# Patient Record
Sex: Male | Born: 1960 | Race: White | Hispanic: No | Smoking: Former smoker
Health system: Southern US, Community
[De-identification: ages and names within clinical notes are randomized; demographics above are authoritative.]

## PROBLEM LIST (undated history)

## (undated) DIAGNOSIS — K219 Gastro-esophageal reflux disease without esophagitis: Secondary | ICD-10-CM

## (undated) HISTORY — PX: CYST EXCISION: SHX5701

## (undated) HISTORY — PX: EYE SURGERY: SHX253

---

## 2006-02-26 ENCOUNTER — Ambulatory Visit (HOSPITAL_COMMUNITY): Admission: RE | Admit: 2006-02-26 | Discharge: 2006-02-26 | Payer: Self-pay | Admitting: Family Medicine

## 2009-09-04 ENCOUNTER — Ambulatory Visit (HOSPITAL_COMMUNITY): Admission: RE | Admit: 2009-09-04 | Discharge: 2009-09-04 | Payer: Self-pay | Admitting: Family Medicine

## 2012-02-02 ENCOUNTER — Other Ambulatory Visit (HOSPITAL_COMMUNITY): Payer: Self-pay | Admitting: Family Medicine

## 2012-02-02 ENCOUNTER — Ambulatory Visit (HOSPITAL_COMMUNITY)
Admission: RE | Admit: 2012-02-02 | Discharge: 2012-02-02 | Disposition: A | Discharge status: Home / Self Care | Payer: Self-pay | Source: Ambulatory Visit | Attending: Family Medicine | Admitting: Family Medicine

## 2012-02-02 DIAGNOSIS — M79671 Pain in right foot: Secondary | ICD-10-CM

## 2012-02-02 DIAGNOSIS — T1490XA Injury, unspecified, initial encounter: Secondary | ICD-10-CM

## 2012-02-02 DIAGNOSIS — M79609 Pain in unspecified limb: Secondary | ICD-10-CM | POA: Insufficient documentation

## 2012-04-24 ENCOUNTER — Emergency Department (HOSPITAL_COMMUNITY)
Admission: EM | Admit: 2012-04-24 | Discharge: 2012-04-24 | Disposition: A | Discharge status: Home / Self Care | Payer: BC Managed Care – PPO | Attending: Emergency Medicine | Admitting: Emergency Medicine

## 2012-04-24 ENCOUNTER — Encounter (HOSPITAL_COMMUNITY): Payer: Self-pay

## 2012-04-24 DIAGNOSIS — Y92009 Unspecified place in unspecified non-institutional (private) residence as the place of occurrence of the external cause: Secondary | ICD-10-CM | POA: Insufficient documentation

## 2012-04-24 DIAGNOSIS — S71109A Unspecified open wound, unspecified thigh, initial encounter: Secondary | ICD-10-CM | POA: Insufficient documentation

## 2012-04-24 DIAGNOSIS — W298XXA Contact with other powered powered hand tools and household machinery, initial encounter: Secondary | ICD-10-CM | POA: Insufficient documentation

## 2012-04-24 DIAGNOSIS — Y9389 Activity, other specified: Secondary | ICD-10-CM | POA: Insufficient documentation

## 2012-04-24 DIAGNOSIS — Z87891 Personal history of nicotine dependence: Secondary | ICD-10-CM | POA: Insufficient documentation

## 2012-04-24 DIAGNOSIS — S81812A Laceration without foreign body, left lower leg, initial encounter: Secondary | ICD-10-CM

## 2012-04-24 DIAGNOSIS — Y929 Unspecified place or not applicable: Secondary | ICD-10-CM | POA: Insufficient documentation

## 2012-04-24 DIAGNOSIS — S71009A Unspecified open wound, unspecified hip, initial encounter: Secondary | ICD-10-CM | POA: Insufficient documentation

## 2012-04-24 MED ORDER — LIDOCAINE HCL (PF) 1 % IJ SOLN
5.0000 mL | Freq: Once | INTRAMUSCULAR | Status: AC
  Administered 2012-04-24: 5 mL via INTRADERMAL
  Filled 2012-04-24: qty 5

## 2012-04-24 NOTE — ED Provider Notes (Signed)
History     CSN: [id number]  Arrival date & time 04/24/12  1620   First MD Initiated Contact with Patient 04/24/12 1724      Chief Complaint  Patient presents with  . Extremity Laceration    (Consider location/radiation/quality/duration/timing/severity/associated sxs/prior treatment) Patient is a 52 y.o. male presenting with skin laceration. The history is provided by the patient.  Laceration Location:  Leg Leg laceration location:  R upper leg Length (cm):  4 cm Depth:  Through dermis Quality: straight   Bleeding: controlled   Time since incident: just PTA. Injury mechanism: laceration occurred from a chainsaw through his jeans. Pain details:    Quality:  Aching   Severity:  Mild   Timing:  Constant   Progression:  Unchanged Foreign body present:  No foreign bodies Relieved by:  Nothing Worsened by:  Nothing tried Ineffective treatments:  None tried Tetanus status:  Up to date   History reviewed. No pertinent past medical history.  Past Surgical History  Procedure Laterality Date  . Eye surgery      No family history on file.  History  Substance Use Topics  . Smoking status: Former Games developer  . Smokeless tobacco: Not on file  . Alcohol Use: Yes     Comment: social drinker       Review of Systems  Constitutional: Negative for fever and chills.  Musculoskeletal: Negative for back pain, joint swelling and arthralgias.  Skin: Positive for wound.       Laceration   Neurological: Negative for dizziness, weakness and numbness.  Hematological: Does not bruise/bleed easily.  All other systems reviewed and are negative.    Allergies  Review of patient's allergies indicates no known allergies.  Home Medications   Current Outpatient Rx  Name  Route  Sig  Dispense  Refill  . naproxen sodium (ANAPROX) 220 MG tablet   Oral   Take 440 mg by mouth 2 (two) times daily with a meal.           BP 136/96  Pulse 122  Temp(Src) 97.6 F (36.4 C) (Oral)  Resp  20  Ht 5\' 10"  (1.778 m)  Wt 200 lb (90.719 kg)  BMI 28.7 kg/m2  SpO2 97%  Physical Exam  Nursing note and vitals reviewed. Constitutional: He is oriented to person, place, and time. He appears well-developed and well-nourished. No distress.  HENT:  Head: Normocephalic and atraumatic.  Cardiovascular: Normal rate, regular rhythm and normal heart sounds.   Pulmonary/Chest: Effort normal and breath sounds normal.  Musculoskeletal: He exhibits no edema and no tenderness.  Neurological: He is alert and oriented to person, place, and time. He exhibits normal muscle tone. Coordination normal.  Skin: Laceration noted.     Laceration to the right upper leg just superior to the knee joint.  Bleeding controlled.  No injuries to the deep structures, pt has full ROM of the knee w/o difficulty.  Sensation intact    ED Course  Procedures (including critical care time)  Labs Reviewed - No data to display No results found.      MDM    LACERATION REPAIR Performed by: TRIPLETT,TAMMY L. Authorized by: Maxwell Caul Consent: Verbal consent obtained. Risks and benefits: risks, benefits and alternatives were discussed Consent given by: patient Patient identity confirmed: provided demographic data Prepped and Draped in normal sterile fashion Wound explored  Laceration Location: left lower leg Laceration Length:  4cm  No Foreign Bodies seen or palpated  Anesthesia: local infiltration  Local anesthetic:  lidocaine1 % w/o epinephrine  Anesthetic total: 3 ml  Irrigation method: syringe Amount of cleaning: standard  Skin closure: 4-0 Ethilon Number of sutures: 6 Technique: simple interrupted  Patient tolerance: Patient tolerated the procedure well with no immediate complications.     Bleeding controlled.  Pt has full ROM of the knee.  No muscle, tendon or nerve injury seen.  No FB's  Dressing applied, pain improved.  Pt ambulates w/o difficulty.  Td is UTD   Wound(s)  explored with adequate hemostasis through ROM, no apparent gross foreign body retained, no significant involvement of deep structures such as bone / joint / tendon / or neurovascular involvement noted.  Baseline Strength and Sensation to affected extremity(ies) with normal light touch for Pt, distal NVI with CR< 2 secs and pulse(s) intact to affected extremity(ies).    Tammy L. Trisha Mangle, PA-C 04/27/12 1812

## 2012-04-24 NOTE — ED Notes (Signed)
Patient presents to ER with c/o laceration to left knee. Patient was cutting down some small trees around house with a chainsaw and the chainsaw slipped and cut his leg. Bleeding controlled at this time. Patient ambulated to triage. Patient has approximately a 2.5 inch laceration above knee.

## 2012-04-29 NOTE — ED Provider Notes (Signed)
Medical screening examination/treatment/procedure(s) were performed by non-physician practitioner and as supervising physician I was immediately available for consultation/collaboration.   Shelda Jakes, MD 04/29/12 [phone]

## 2012-08-05 ENCOUNTER — Other Ambulatory Visit (HOSPITAL_COMMUNITY): Payer: Self-pay | Admitting: Family Medicine

## 2012-08-05 ENCOUNTER — Ambulatory Visit (HOSPITAL_COMMUNITY)
Admission: RE | Admit: 2012-08-05 | Discharge: 2012-08-05 | Disposition: A | Discharge status: Home / Self Care | Payer: BC Managed Care – PPO | Source: Ambulatory Visit | Attending: Family Medicine | Admitting: Family Medicine

## 2012-08-05 DIAGNOSIS — Z87891 Personal history of nicotine dependence: Secondary | ICD-10-CM | POA: Insufficient documentation

## 2012-09-30 ENCOUNTER — Emergency Department (HOSPITAL_COMMUNITY): Payer: BC Managed Care – PPO | Admitting: Anesthesiology

## 2012-09-30 ENCOUNTER — Encounter (HOSPITAL_COMMUNITY)
Admission: EM | Disposition: A | Discharge status: Home / Self Care | Payer: Self-pay | Source: Home / Self Care | Attending: Emergency Medicine

## 2012-09-30 ENCOUNTER — Emergency Department (HOSPITAL_COMMUNITY): Payer: BC Managed Care – PPO

## 2012-09-30 ENCOUNTER — Encounter (HOSPITAL_COMMUNITY): Payer: Self-pay | Admitting: Emergency Medicine

## 2012-09-30 ENCOUNTER — Encounter (HOSPITAL_COMMUNITY): Payer: Self-pay | Admitting: Anesthesiology

## 2012-09-30 ENCOUNTER — Emergency Department (HOSPITAL_COMMUNITY)
Admission: EM | Admit: 2012-09-30 | Discharge: 2012-09-30 | Disposition: A | Discharge status: Home / Self Care | Payer: BC Managed Care – PPO | Attending: General Surgery | Admitting: General Surgery

## 2012-09-30 DIAGNOSIS — K3580 Unspecified acute appendicitis: Secondary | ICD-10-CM | POA: Insufficient documentation

## 2012-09-30 DIAGNOSIS — Z01812 Encounter for preprocedural laboratory examination: Secondary | ICD-10-CM | POA: Insufficient documentation

## 2012-09-30 HISTORY — PX: LAPAROSCOPIC APPENDECTOMY: SHX408

## 2012-09-30 HISTORY — DX: Gastro-esophageal reflux disease without esophagitis: K21.9

## 2012-09-30 LAB — URINE MICROSCOPIC-ADD ON

## 2012-09-30 LAB — URINALYSIS, ROUTINE W REFLEX MICROSCOPIC
Ketones, ur: 15 mg/dL — AB
Nitrite: NEGATIVE
pH: 6 (ref 5.0–8.0)

## 2012-09-30 LAB — CBC WITH DIFFERENTIAL/PLATELET
Basophils Absolute: 0 10*3/uL (ref 0.0–0.1)
Eosinophils Absolute: 0 10*3/uL (ref 0.0–0.7)
Hemoglobin: 14.8 g/dL (ref 13.0–17.0)
Lymphs Abs: 1 10*3/uL (ref 0.7–4.0)
MCV: 88.9 fL (ref 78.0–100.0)
Platelets: 192 10*3/uL (ref 150–400)
RBC: 4.87 MIL/uL (ref 4.22–5.81)
RDW: 13.5 % (ref 11.5–15.5)

## 2012-09-30 LAB — COMPREHENSIVE METABOLIC PANEL
Albumin: 4.2 g/dL (ref 3.5–5.2)
Alkaline Phosphatase: 71 U/L (ref 39–117)
CO2: 26 mEq/L (ref 19–32)
Calcium: 9.4 mg/dL (ref 8.4–10.5)
Chloride: 96 mEq/L (ref 96–112)
GFR calc Af Amer: 87 mL/min — ABNORMAL LOW (ref >90)
Glucose, Bld: 119 mg/dL — ABNORMAL HIGH (ref 70–99)
Potassium: 4 mEq/L (ref 3.5–5.1)
Total Bilirubin: 0.5 mg/dL (ref 0.3–1.2)
Total Protein: 7.6 g/dL (ref 6.0–8.3)

## 2012-09-30 LAB — LIPASE, BLOOD: Lipase: 12 U/L (ref 11–59)

## 2012-09-30 SURGERY — APPENDECTOMY, LAPAROSCOPIC
Anesthesia: General | Site: Abdomen | Wound class: Contaminated

## 2012-09-30 MED ORDER — LACTATED RINGERS IV SOLN
INTRAVENOUS | Status: DC
  Administered 2012-09-30: 1000 mL via INTRAVENOUS

## 2012-09-30 MED ORDER — IOHEXOL 300 MG/ML  SOLN
50.0000 mL | Freq: Once | INTRAMUSCULAR | Status: AC | PRN
  Administered 2012-09-30: 50 mL via ORAL

## 2012-09-30 MED ORDER — KETOROLAC TROMETHAMINE 30 MG/ML IJ SOLN
30.0000 mg | Freq: Once | INTRAMUSCULAR | Status: AC
  Administered 2012-09-30: 30 mg via INTRAVENOUS

## 2012-09-30 MED ORDER — SUCCINYLCHOLINE CHLORIDE 20 MG/ML IJ SOLN
INTRAMUSCULAR | Status: DC | PRN
  Administered 2012-09-30: 120 mg via INTRAVENOUS

## 2012-09-30 MED ORDER — LACTATED RINGERS IV SOLN
INTRAVENOUS | Status: DC | PRN
  Administered 2012-09-30 (×2): via INTRAVENOUS

## 2012-09-30 MED ORDER — ONDANSETRON HCL 4 MG/2ML IJ SOLN: 4.0000 mg | Freq: Once | INTRAMUSCULAR | Status: DC | PRN

## 2012-09-30 MED ORDER — ENOXAPARIN SODIUM 40 MG/0.4ML ~~LOC~~ SOLN
40.0000 mg | Freq: Once | SUBCUTANEOUS | Status: AC
  Administered 2012-09-30: 40 mg via SUBCUTANEOUS

## 2012-09-30 MED ORDER — PROPOFOL 10 MG/ML IV BOLUS
INTRAVENOUS | Status: DC | PRN
  Administered 2012-09-30: 20 mg via INTRAVENOUS
  Administered 2012-09-30: 150 mg via INTRAVENOUS

## 2012-09-30 MED ORDER — SODIUM CHLORIDE 0.9 % IV SOLN
1.0000 g | INTRAVENOUS | Status: AC
  Administered 2012-09-30: 1 g via INTRAVENOUS

## 2012-09-30 MED ORDER — ONDANSETRON HCL 4 MG/2ML IJ SOLN
4.0000 mg | Freq: Once | INTRAMUSCULAR | Status: AC
  Administered 2012-09-30: 4 mg via INTRAVENOUS
  Filled 2012-09-30: qty 2

## 2012-09-30 MED ORDER — LIDOCAINE HCL (CARDIAC) 20 MG/ML IV SOLN
INTRAVENOUS | Status: DC | PRN
  Administered 2012-09-30: 20 mg via INTRAVENOUS

## 2012-09-30 MED ORDER — FENTANYL CITRATE 0.05 MG/ML IJ SOLN
INTRAMUSCULAR | Status: DC | PRN
  Administered 2012-09-30 (×6): 50 ug via INTRAVENOUS

## 2012-09-30 MED ORDER — OXYCODONE-ACETAMINOPHEN 7.5-325 MG PO TABS: 1.0000 | ORAL_TABLET | ORAL | Status: DC | PRN

## 2012-09-30 MED ORDER — NEOSTIGMINE METHYLSULFATE 1 MG/ML IJ SOLN
INTRAMUSCULAR | Status: DC | PRN
  Administered 2012-09-30: 1 mg via INTRAVENOUS
  Administered 2012-09-30: 2 mg via INTRAVENOUS

## 2012-09-30 MED ORDER — MORPHINE SULFATE 4 MG/ML IJ SOLN
4.0000 mg | Freq: Once | INTRAMUSCULAR | Status: AC
  Administered 2012-09-30: 4 mg via INTRAVENOUS
  Filled 2012-09-30: qty 1

## 2012-09-30 MED ORDER — ROCURONIUM BROMIDE 100 MG/10ML IV SOLN
INTRAVENOUS | Status: DC | PRN
  Administered 2012-09-30 (×2): 5 mg via INTRAVENOUS
  Administered 2012-09-30: 20 mg via INTRAVENOUS
  Administered 2012-09-30: 5 mg via INTRAVENOUS

## 2012-09-30 MED ORDER — IOHEXOL 300 MG/ML  SOLN
100.0000 mL | Freq: Once | INTRAMUSCULAR | Status: AC | PRN
  Administered 2012-09-30: 100 mL via INTRAVENOUS

## 2012-09-30 MED ORDER — ONDANSETRON HCL 4 MG/2ML IJ SOLN
4.0000 mg | Freq: Once | INTRAMUSCULAR | Status: AC
  Administered 2012-09-30: 4 mg via INTRAVENOUS

## 2012-09-30 MED ORDER — MIDAZOLAM HCL 5 MG/5ML IJ SOLN
INTRAMUSCULAR | Status: DC | PRN
  Administered 2012-09-30 (×2): 1 mg via INTRAVENOUS

## 2012-09-30 MED ORDER — MIDAZOLAM HCL 2 MG/2ML IJ SOLN
1.0000 mg | INTRAMUSCULAR | Status: DC | PRN
Start: 2012-09-30 — End: 2012-09-30
  Administered 2012-09-30: 2 mg via INTRAVENOUS

## 2012-09-30 MED ORDER — AMOXICILLIN-POT CLAVULANATE 875-125 MG PO TABS: 1.0000 | ORAL_TABLET | Freq: Two times a day (BID) | ORAL | Status: DC

## 2012-09-30 MED ORDER — BUPIVACAINE HCL (PF) 0.5 % IJ SOLN
INTRAMUSCULAR | Status: DC | PRN
  Administered 2012-09-30: 10 mL

## 2012-09-30 MED ORDER — FENTANYL CITRATE 0.05 MG/ML IJ SOLN
25.0000 ug | INTRAMUSCULAR | Status: AC
  Administered 2012-09-30 (×2): 25 ug via INTRAVENOUS

## 2012-09-30 MED ORDER — FENTANYL CITRATE 0.05 MG/ML IJ SOLN
25.0000 ug | INTRAMUSCULAR | Status: DC | PRN
  Administered 2012-09-30 (×2): 50 ug via INTRAVENOUS

## 2012-09-30 MED ORDER — GLYCOPYRROLATE 0.2 MG/ML IJ SOLN
INTRAMUSCULAR | Status: DC | PRN
  Administered 2012-09-30: 0.2 mg via INTRAVENOUS
  Administered 2012-09-30: 0.4 mg via INTRAVENOUS

## 2012-09-30 MED ORDER — SODIUM CHLORIDE 0.9 % IR SOLN
Status: DC | PRN
  Administered 2012-09-30: 1000 mL
  Administered 2012-09-30: 3000 mL

## 2012-09-30 SURGICAL SUPPLY — 48 items
BAG HAMPER (MISCELLANEOUS) ×2 IMPLANT
BAG SPEC RTRVL LRG 6X4 10 (ENDOMECHANICALS) ×1
CLOTH BEACON ORANGE TIMEOUT ST (SAFETY) ×2 IMPLANT
COVER LIGHT HANDLE STERIS (MISCELLANEOUS) ×4 IMPLANT
CUTTER LINEAR ENDO 35 ETS (STAPLE) IMPLANT
CUTTER LINEAR ENDO 35 ETS TH (STAPLE) ×1 IMPLANT
DECANTER SPIKE VIAL GLASS SM (MISCELLANEOUS) ×2 IMPLANT
DURAPREP 26ML APPLICATOR (WOUND CARE) ×2 IMPLANT
ELECT REM PT RETURN 9FT ADLT (ELECTROSURGICAL) ×2
ELECTRODE REM PT RTRN 9FT ADLT (ELECTROSURGICAL) ×1 IMPLANT
FILTER SMOKE EVAC LAPAROSHD (FILTER) ×2 IMPLANT
FORMALIN 10 PREFIL 120ML (MISCELLANEOUS) ×2 IMPLANT
GLOVE BIO SURGEON STRL SZ7.5 (GLOVE) ×2 IMPLANT
GLOVE BIOGEL PI IND STRL 7.0 (GLOVE) IMPLANT
GLOVE BIOGEL PI INDICATOR 7.0 (GLOVE) ×1
GLOVE EXAM NITRILE LRG STRL (GLOVE) ×2 IMPLANT
GLOVE SS BIOGEL STRL SZ 6.5 (GLOVE) IMPLANT
GLOVE SUPERSENSE BIOGEL SZ 6.5 (GLOVE) ×1
GOWN STRL REIN XL XLG (GOWN DISPOSABLE) ×4 IMPLANT
INST SET LAPROSCOPIC AP (KITS) ×2 IMPLANT
IV NS IRRIG 3000ML ARTHROMATIC (IV SOLUTION) ×1 IMPLANT
KIT ROOM TURNOVER APOR (KITS) ×2 IMPLANT
MANIFOLD NEPTUNE II (INSTRUMENTS) ×2 IMPLANT
NDL INSUFFLATION 14GA 120MM (NEEDLE) ×1 IMPLANT
NEEDLE INSUFFLATION 14GA 120MM (NEEDLE) ×2 IMPLANT
NS IRRIG 1000ML POUR BTL (IV SOLUTION) ×2 IMPLANT
PACK LAP CHOLE LZT030E (CUSTOM PROCEDURE TRAY) ×2 IMPLANT
PAD ARMBOARD 7.5X6 YLW CONV (MISCELLANEOUS) ×2 IMPLANT
PENCIL HANDSWITCHING (ELECTRODE) ×1 IMPLANT
POUCH SPECIMEN RETRIEVAL 10MM (ENDOMECHANICALS) ×2 IMPLANT
RELOAD /EVU35 (ENDOMECHANICALS) IMPLANT
RELOAD 45 VASCULAR/THIN (ENDOMECHANICALS) IMPLANT
RELOAD CUTTER ETS 35MM STAND (ENDOMECHANICALS) IMPLANT
RELOAD STAPLE 45 2.5 WHT GRN (ENDOMECHANICALS) IMPLANT
SCALPEL HARMONIC ACE (MISCELLANEOUS) ×2 IMPLANT
SET BASIN LINEN APH (SET/KITS/TRAYS/PACK) ×2 IMPLANT
SET TUBE IRRIG SUCTION NO TIP (IRRIGATION / IRRIGATOR) ×1 IMPLANT
SPONGE GAUZE 2X2 8PLY STRL LF (GAUZE/BANDAGES/DRESSINGS) ×6 IMPLANT
STAPLER VISISTAT (STAPLE) ×2 IMPLANT
SUT VICRYL 0 UR6 27IN ABS (SUTURE) ×2 IMPLANT
TAPE CLOTH SURG 4X10 WHT LF (GAUZE/BANDAGES/DRESSINGS) ×1 IMPLANT
TRAY FOLEY CATH 16FR SILVER (SET/KITS/TRAYS/PACK) ×2 IMPLANT
TROCAR Z-THAD FIOS HNDL 12X100 (TROCAR) ×2 IMPLANT
TROCAR Z-THRD FIOS HNDL 11X100 (TROCAR) ×2 IMPLANT
TROCAR Z-THREAD FIOS 5X100MM (TROCAR) ×2 IMPLANT
TUBING INSUFFLATION (TUBING) ×2 IMPLANT
WARMER LAPAROSCOPE (MISCELLANEOUS) ×2 IMPLANT
YANKAUER SUCT 12FT TUBE ARGYLE (SUCTIONS) ×2 IMPLANT

## 2012-09-30 NOTE — H&P (Signed)
David Adkins is an 52 y.o. male.   Chief Complaint: Abdominal pain HPI: Patient is a 52 year old white male who presents to the emergency room with a less than 12 hour history of worsening right lower quadrant abdominal pain. He was noted to have a leukocytosis. CT scan the abdomen and pelvis reveals acute appendicitis.  History reviewed. No pertinent past medical history.  Past Surgical History  Procedure Laterality Date  . Eye surgery      No family history on file. Social History:  reports that he has quit smoking. He does not have any smokeless tobacco history on file. He reports that  drinks alcohol. He reports that he does not use illicit drugs.  Allergies: No Known Allergies   (Not in a hospital admission)  Results for orders placed during the hospital encounter of 09/30/12 (from the past 48 hour(s))  URINALYSIS, ROUTINE W REFLEX MICROSCOPIC     Status: Abnormal   Collection Time    09/30/12  9:45 AM      Result Value Range   Color, Urine YELLOW  YELLOW   APPearance CLEAR  CLEAR   Specific Gravity, Urine 1.025  1.005 - 1.030   pH 6.0  5.0 - 8.0   Glucose, UA NEGATIVE  NEGATIVE mg/dL   Hgb urine dipstick TRACE (*) NEGATIVE   Bilirubin Urine NEGATIVE  NEGATIVE   Ketones, ur 15 (*) NEGATIVE mg/dL   Protein, ur NEGATIVE  NEGATIVE mg/dL   Urobilinogen, UA 0.2  0.0 - 1.0 mg/dL   Nitrite NEGATIVE  NEGATIVE   Leukocytes, UA NEGATIVE  NEGATIVE  URINE MICROSCOPIC-ADD ON     Status: None   Collection Time    09/30/12  9:45 AM      Result Value Range   RBC / HPF 3-6  <3 RBC/hpf  CBC WITH DIFFERENTIAL     Status: Abnormal   Collection Time    09/30/12  9:50 AM      Result Value Range   WBC 24.8 (*) 4.0 - 10.5 K/uL   RBC 4.87  4.22 - 5.81 MIL/uL   Hemoglobin 14.8  13.0 - 17.0 g/dL   HCT 16.1  09.6 - 04.5 %   MCV 88.9  78.0 - 100.0 fL   MCH 30.4  26.0 - 34.0 pg   MCHC 34.2  30.0 - 36.0 g/dL   RDW 40.9  81.1 - 91.4 %   Platelets 192  150 - 400 K/uL   Neutrophils  Relative % 91 (*) 43 - 77 %   Neutro Abs 22.6 (*) 1.7 - 7.7 K/uL   Lymphocytes Relative 4 (*) 12 - 46 %   Lymphs Abs 1.0  0.7 - 4.0 K/uL   Monocytes Relative 5  3 - 12 %   Monocytes Absolute 1.2 (*) 0.1 - 1.0 K/uL   Eosinophils Relative 0  0 - 5 %   Eosinophils Absolute 0.0  0.0 - 0.7 K/uL   Basophils Relative 0  0 - 1 %   Basophils Absolute 0.0  0.0 - 0.1 K/uL   WBC Morphology INCREASED BANDS (>20% BANDS)     Comment: TOXIC GRANULATION  COMPREHENSIVE METABOLIC PANEL     Status: Abnormal   Collection Time    09/30/12  9:50 AM      Result Value Range   Sodium 133 (*) 135 - 145 mEq/L   Potassium 4.0  3.5 - 5.1 mEq/L   Chloride 96  96 - 112 mEq/L   CO2 26  19 -  32 mEq/L   Glucose, Bld 119 (*) 70 - 99 mg/dL   BUN 18  6 - 23 mg/dL   Creatinine, Ser 1.61  0.50 - 1.35 mg/dL   Calcium 9.4  8.4 - 09.6 mg/dL   Total Protein 7.6  6.0 - 8.3 g/dL   Albumin 4.2  3.5 - 5.2 g/dL   AST 19  0 - 37 U/L   ALT 19  0 - 53 U/L   Alkaline Phosphatase 71  39 - 117 U/L   Total Bilirubin 0.5  0.3 - 1.2 mg/dL   GFR calc non Af Amer 75 (*) >90 mL/min   GFR calc Af Amer 87 (*) >90 mL/min   Comment:            The eGFR has been calculated     using the CKD EPI equation.     This calculation has not been     validated in all clinical     situations.     eGFR's persistently     <90 mL/min signify     possible Chronic Kidney Disease.  LIPASE, BLOOD     Status: None   Collection Time    09/30/12  9:50 AM      Result Value Range   Lipase 12  11 - 59 U/L   Ct Abdomen Pelvis W Contrast  09/30/2012   *RADIOLOGY REPORT*  Clinical Data: Pelvic pain.  Nausea and vomiting.  CT ABDOMEN AND PELVIS WITH CONTRAST  Technique:  Multidetector CT imaging of the abdomen and pelvis was performed following the standard protocol during bolus administration of intravenous contrast.  Contrast: 50mL OMNIPAQUE IOHEXOL 300 MG/ML  SOLN, OMNIPAQUE IOHEXOL 300 MG/ML  SOLN  Comparison: None.  Findings: Subsegmental atelectasis  or scarring noted in the left lower lobe.  The liver, spleen, pancreas, and adrenal glands appear unremarkable.  The gallbladder and biliary system appear unremarkable.  The kidneys appear unremarkable, as do the proximal ureters.  Acute appendicitis is observed with appendiceal diameter 1.3 cm and with considerable periappendiceal stranding.  I do not observe extraluminal gas or a drainable abscess.  High density in the appendix may represent appendicoliths.  Small right inguinal hernia contains adipose tissue.  Mild aortoiliac atherosclerotic vascular disease noted.  No free pelvic fluid.  Urinary bladder unremarkable.  Lumbar spondylosis and degenerative disc disease observed.  IMPRESSION:  1.  Acute appendicitis, appendiceal diameter 1.3 cm, with considerable periappendiceal stranding.  No extraluminal gas or drainable abscess. 2.  Small right inguinal hernia contains adipose tissue. 3.  Mild atherosclerosis. 4.  Lumbar spondylosis and degenerative disc disease.  These results will be called to the ordering clinician or representative by the Radiologist Assistant, and communication documented in the PACS Dashboard.   Original Report Authenticated By: Gaylyn Rong, M.D.    Review of Systems  Constitutional: Positive for malaise/fatigue.  Gastrointestinal: Positive for abdominal pain.  All other systems reviewed and are negative.    Blood pressure 114/76, pulse 69, temperature 97.5 F (36.4 C), resp. rate 18, height 5\' 9"  (1.753 m), weight 80.74 kg (178 lb), SpO2 97.00%. Physical Exam  Constitutional: He is oriented to person, place, and time. He appears well-developed and well-nourished.  HENT:  Head: Normocephalic and atraumatic.  Neck: Normal range of motion. Neck supple.  Cardiovascular: Normal rate, regular rhythm and normal heart sounds.   Respiratory: Effort normal and breath sounds normal.  GI: Soft. There is tenderness.  Tender in the right lower quadrant to deep palpation.  No  rigidity noted.  Neurological: He is alert and oriented to person, place, and time.  Skin: Skin is warm and dry.     Assessment/Plan Impression: Acute appendicitis Plan: Patient will be taken to the operating room today for laparoscopic appendectomy. The risks and benefits of the procedure including bleeding, infection, and the possibility of an open procedure were fully explained to the patient, who gave informed consent.  Liahm Grivas A 09/30/2012, 11:24 AM

## 2012-09-30 NOTE — ED Notes (Signed)
Pt c/o generalized abd pain since 0330. Denies n/v/d. lnbm this am. nad noted.

## 2012-09-30 NOTE — Transfer of Care (Signed)
Immediate Anesthesia Transfer of Care Note  Patient: David Adkins  Procedure(s) Performed: Procedure(s) (LRB): APPENDECTOMY LAPAROSCOPIC (N/A)  Patient Location: PACU  Anesthesia Type: General  Level of Consciousness: awake  Airway & Oxygen Therapy: Patient Spontanous Breathing and non-rebreather face mask  Post-op Assessment: Report given to PACU RN, Post -op Vital signs reviewed and stable and Patient moving all extremities  Post vital signs: Reviewed and stable  Complications: No apparent anesthesia complications

## 2012-09-30 NOTE — ED Provider Notes (Signed)
Medical screening examination/treatment/procedure(s) were performed by non-physician practitioner and as supervising physician I was immediately available for consultation/collaboration.   Tenika Keeran, MD 09/30/12 1438 

## 2012-09-30 NOTE — Anesthesia Preprocedure Evaluation (Signed)
Anesthesia Evaluation  Patient identified by MRN, date of birth, ID band Patient awake    Reviewed: Allergy & Precautions, H&P , NPO status , Patient's Chart, lab work & pertinent test results  History of Anesthesia Complications Negative for: history of anesthetic complications  Airway Mallampati: II TM Distance: >3 FB Neck ROM: Full    Dental  (+) Teeth Intact   Pulmonary neg pulmonary ROS, former smoker,  breath sounds clear to auscultation        Cardiovascular negative cardio ROS  Rhythm:Regular Rate:Normal     Neuro/Psych    GI/Hepatic GERD-  Medicated and Controlled,RLQ pain   Endo/Other    Renal/GU      Musculoskeletal   Abdominal   Peds  Hematology   Anesthesia Other Findings   Reproductive/Obstetrics                           Anesthesia Physical Anesthesia Plan  ASA: II  Anesthesia Plan: General   Post-op Pain Management:    Induction: Intravenous, Rapid sequence and Cricoid pressure planned  Airway Management Planned: Oral ETT  Additional Equipment:   Intra-op Plan:   Post-operative Plan: Extubation in OR  Informed Consent: I have reviewed the patients History and Physical, chart, labs and discussed the procedure including the risks, benefits and alternatives for the proposed anesthesia with the patient or authorized representative who has indicated his/her understanding and acceptance.     Plan Discussed with:   Anesthesia Plan Comments:         Anesthesia Quick Evaluation

## 2012-09-30 NOTE — Anesthesia Procedure Notes (Signed)
Procedure Name: Intubation Date/Time: 09/30/2012 1:36 PM Performed by: Franco Nones Pre-anesthesia Checklist: Patient identified, Patient being monitored, Timeout performed, Emergency Drugs available and Suction available Patient Re-evaluated:Patient Re-evaluated prior to inductionOxygen Delivery Method: Circle System Utilized Preoxygenation: Pre-oxygenation with 100% oxygen Intubation Type: IV induction, Rapid sequence and Cricoid Pressure applied Laryngoscope Size: Miller and 2 Grade View: Grade I Tube type: Oral Tube size: 8.0 mm Number of attempts: 1 Airway Equipment and Method: stylet Placement Confirmation: ETT inserted through vocal cords under direct vision,  positive ETCO2 and breath sounds checked- equal and bilateral Secured at: 22 cm Tube secured with: Tape Dental Injury: Teeth and Oropharynx as per pre-operative assessment

## 2012-09-30 NOTE — Anesthesia Postprocedure Evaluation (Signed)
Anesthesia Post Note  Patient: David Adkins  Procedure(s) Performed: Procedure(s) (LRB): APPENDECTOMY LAPAROSCOPIC (N/A)  Anesthesia type: General  Patient location: PACU  Post pain: Pain level controlled  Post assessment: Post-op Vital signs reviewed, Patient's Cardiovascular Status Stable, Respiratory Function Stable, Patent Airway, No signs of Nausea or vomiting and Pain level controlled  Last Vitals:  Filed Vitals:   09/30/12 1439  BP: 144/79  Pulse: 92  Temp: 36.9 C  Resp: 24    Post vital signs: Reviewed and stable  Level of consciousness: awake and alert   Complications: No apparent anesthesia complications

## 2012-09-30 NOTE — Op Note (Signed)
Patient:  David Adkins  DOB:  Jul 10, 1960  MRN:  [id number]   Preop Diagnosis:  Acute appendicitis  Postop Diagnosis:  Same  Procedure:  Laparoscopic appendectomy  Surgeon:  Franky Macho, M.D.  Anes:  General endotracheal  Indications:  Patient is a 52 year old white male who presents with a less than 12 hour history of worsening right lower quadrant abdominal pain. He is on CT scan the abdomen to have acute appendicitis. It is somewhat retrocecal in nature. The risks and benefits of the procedure including bleeding, infection, and the possibility of an open procedure were fully explained to the patient, who gave informed consent.  Procedure note:  The patient is placed the supine position. After induction of general endotracheal anesthesia, the abdomen was prepped and draped using usual sterile technique with DuraPrep. Surgical site confirmation was performed.  A supraumbilical incision was made down to the fascia. A Veress needle was introduced into the abdominal cavity and confirmation of placement was done using the saline drop test. The abdomen was then insufflated to 16 mm mercury pressure. An 11 mm trocar was introduced into the abdominal cavity under direct visualization without difficulty. The patient was placed in deeper Trendelenburg position and additional 12 mm trocar was placed the suprapubic region and a 5 mm trocar was placed left lower corner region. The appendix was visualized and noted to be retrocecal in nature. The tip of the appendix had curved and was pointed inferior nature. While this was being grasped, a fecalith spilled out of it. It was retrieved. There was minimal spillage of any purulent fluid. The appendix was noted to be somewhat gangrenous and cystoscopy half. Using both the harmonic scalpel and blunt dissection, the appendix was freed away from its retrocecal position. The mesoappendix was carefully divided using the harmonic scalpel. Once the base the appendix  was visualized, a vascular Endo GIA stapler was placed across the base the appendix and fired. The appendix was then removed using an Endo Catch bag and sent to pathology further examination. The right lower quadrant was copiously irrigated normal saline. All fluid and air were then evacuated from the abdominal cavity prior to removal of the trochars.  All wounds were irrigated normal saline. All wounds were injected with 0.5% Sensorcaine. The supraumbilical fascia as well as suprapubic fascia were reapproximated using 0 Vicryl interrupted sutures. All skin incisions were closed using staples. Betadine ointment and dressed a dressings were applied.  All tape and needle counts were correct the end of the procedure. Patient was extubated in the operating room and transferred to PACU in stable condition.  Complications:  None  EBL:  Minimal  Specimen:  Appendix

## 2012-09-30 NOTE — Preoperative (Signed)
Beta Blockers   Reason not to administer Beta Blockers:Not Applicable 

## 2012-09-30 NOTE — ED Notes (Signed)
[facility] Radiology called with results of appendicitis. Given to PA J Idol.

## 2012-09-30 NOTE — ED Provider Notes (Signed)
CSN: [id number]     Arrival date & time 09/30/12  4098 History     First MD Initiated Contact with Patient 09/30/12 [phone]     Chief Complaint  Patient presents with  . Abdominal Pain   (Consider location/radiation/quality/duration/timing/severity/associated sxs/prior Treatment) HPI Comments: David Adkins is a 52 y.o. Male presenting with lower bilateral abdominal pain with intermittent sharp stabs of pain into his back and upper abdomen which woke him around 3:30 am today.  He denies nausea or vomiting and has had no fevers or chills, no diarrhea or constipation,  Stating his last normal movement was this morning, was non bloody and did not alleviate or worsen his symptoms.  However the pain has escalated and is now constant.  He feels like he has "bad gas" and has tried tums and drank epsom salt water with no relief.  He denies prior episodes of similar pain and has no past medical or surgical history.      The history is provided by the patient.    History reviewed. No pertinent past medical history. Past Surgical History  Procedure Laterality Date  . Eye surgery     No family history on file. History  Substance Use Topics  . Smoking status: Former Games developer  . Smokeless tobacco: Not on file  . Alcohol Use: Yes     Comment: social drinker     Review of Systems  Constitutional: Negative for fever and chills.  HENT: Negative for congestion, sore throat and neck pain.   Eyes: Negative.   Respiratory: Negative for chest tightness and shortness of breath.   Cardiovascular: Negative for chest pain.  Gastrointestinal: Positive for abdominal pain and abdominal distention. Negative for nausea, vomiting, diarrhea and constipation.  Genitourinary: Negative.  Negative for dysuria and difficulty urinating.  Musculoskeletal: Negative for joint swelling and arthralgias.  Skin: Negative.  Negative for rash and wound.  Neurological: Negative for dizziness, weakness, light-headedness,  numbness and headaches.  Psychiatric/Behavioral: Negative.     Allergies  Review of patient's allergies indicates no known allergies.  Home Medications   Current Outpatient Rx  Name  Route  Sig  Dispense  Refill  . HYDROcodone-acetaminophen (NORCO) 10-325 MG per tablet   Oral   Take 1 tablet by mouth every 6 (six) hours as needed for pain.         Marland Kitchen ibuprofen (ADVIL,MOTRIN) 800 MG tablet   Oral   Take 800 mg by mouth every 8 (eight) hours as needed for pain.          BP 114/76  Pulse 69  Temp(Src) 97.5 F (36.4 C)  Resp 18  Ht 5\' 9"  (1.753 m)  Wt 178 lb (80.74 kg)  BMI 26.27 kg/m2  SpO2 97% Physical Exam  Nursing note and vitals reviewed. Constitutional: He appears well-developed and well-nourished. He appears distressed.  Patient appears fairly uncomfortable.  HENT:  Head: Normocephalic and atraumatic.  Mouth/Throat: Oropharynx is clear and moist.  Eyes: Conjunctivae are normal.  Neck: Normal range of motion.  Cardiovascular: Normal rate, regular rhythm, normal heart sounds and intact distal pulses.   Pulmonary/Chest: Effort normal and breath sounds normal. He has no wheezes.  Abdominal: He exhibits distension. He exhibits no abdominal bruit and no ascites. Bowel sounds are increased. There is generalized tenderness. There is guarding. There is no rebound and no CVA tenderness.  Musculoskeletal: Normal range of motion.  Neurological: He is alert.  Skin: Skin is warm and dry. He is not diaphoretic.  Psychiatric:  He has a normal mood and affect.    ED Course   Procedures (including critical care time)  Labs Reviewed  CBC WITH DIFFERENTIAL - Abnormal; Notable for the following:    WBC 24.8 (*)    Neutrophils Relative % 91 (*)    Neutro Abs 22.6 (*)    Lymphocytes Relative 4 (*)    Monocytes Absolute 1.2 (*)    All other components within normal limits  COMPREHENSIVE METABOLIC PANEL - Abnormal; Notable for the following:    Sodium 133 (*)    Glucose, Bld  119 (*)    GFR calc non Af Amer 75 (*)    GFR calc Af Amer 87 (*)    All other components within normal limits  URINALYSIS, ROUTINE W REFLEX MICROSCOPIC - Abnormal; Notable for the following:    Hgb urine dipstick TRACE (*)    Ketones, ur 15 (*)    All other components within normal limits  LIPASE, BLOOD  URINE MICROSCOPIC-ADD ON   Ct Abdomen Pelvis W Contrast  09/30/2012   *RADIOLOGY REPORT*  Clinical Data: Pelvic pain.  Nausea and vomiting.  CT ABDOMEN AND PELVIS WITH CONTRAST  Technique:  Multidetector CT imaging of the abdomen and pelvis was performed following the standard protocol during bolus administration of intravenous contrast.  Contrast: 50mL OMNIPAQUE IOHEXOL 300 MG/ML  SOLN, OMNIPAQUE IOHEXOL 300 MG/ML  SOLN  Comparison: None.  Findings: Subsegmental atelectasis or scarring noted in the left lower lobe.  The liver, spleen, pancreas, and adrenal glands appear unremarkable.  The gallbladder and biliary system appear unremarkable.  The kidneys appear unremarkable, as do the proximal ureters.  Acute appendicitis is observed with appendiceal diameter 1.3 cm and with considerable periappendiceal stranding.  I do not observe extraluminal gas or a drainable abscess.  High density in the appendix may represent appendicoliths.  Small right inguinal hernia contains adipose tissue.  Mild aortoiliac atherosclerotic vascular disease noted.  No free pelvic fluid.  Urinary bladder unremarkable.  Lumbar spondylosis and degenerative disc disease observed.  IMPRESSION:  1.  Acute appendicitis, appendiceal diameter 1.3 cm, with considerable periappendiceal stranding.  No extraluminal gas or drainable abscess. 2.  Small right inguinal hernia contains adipose tissue. 3.  Mild atherosclerosis. 4.  Lumbar spondylosis and degenerative disc disease.  These results will be called to the ordering clinician or representative by the Radiologist Assistant, and communication documented in the PACS Dashboard.    Original Report Authenticated By: Gaylyn Rong, M.D.   1. Appendicitis, acute     MDM  Patients labs and/or radiological studies were viewed and considered during the medical decision making and disposition process.  Spoke with pt informing him of diagnosis.  Call placed to Dr. Lovell Sheehan - will be here in a few minutes to eval patient.  He is comfortable at this time after second dose of abx.  Burgess Amor, PA-C 09/30/12 1112

## 2012-09-30 NOTE — ED Notes (Signed)
Dr Jenkins in with pt. 

## 2012-09-30 NOTE — ED Notes (Signed)
Ct aware pt finished contrast 

## 2012-10-03 ENCOUNTER — Encounter (HOSPITAL_COMMUNITY): Payer: Self-pay | Admitting: General Surgery

## 2012-11-17 ENCOUNTER — Ambulatory Visit (INDEPENDENT_AMBULATORY_CARE_PROVIDER_SITE_OTHER): Payer: BC Managed Care – PPO | Admitting: Otolaryngology

## 2012-11-17 DIAGNOSIS — R220 Localized swelling, mass and lump, head: Secondary | ICD-10-CM

## 2012-11-18 ENCOUNTER — Other Ambulatory Visit (INDEPENDENT_AMBULATORY_CARE_PROVIDER_SITE_OTHER): Payer: Self-pay | Admitting: Otolaryngology

## 2012-11-22 ENCOUNTER — Ambulatory Visit (HOSPITAL_COMMUNITY)
Admission: RE | Admit: 2012-11-22 | Discharge: 2012-11-22 | Disposition: A | Discharge status: Home / Self Care | Payer: BC Managed Care – PPO | Source: Ambulatory Visit | Attending: Otolaryngology | Admitting: Otolaryngology

## 2012-11-22 DIAGNOSIS — R220 Localized swelling, mass and lump, head: Secondary | ICD-10-CM | POA: Insufficient documentation

## 2012-11-22 MED ORDER — IOHEXOL 300 MG/ML  SOLN
75.0000 mL | Freq: Once | INTRAMUSCULAR | Status: AC | PRN
  Administered 2012-11-22: 75 mL via INTRAVENOUS

## 2012-12-01 ENCOUNTER — Ambulatory Visit (INDEPENDENT_AMBULATORY_CARE_PROVIDER_SITE_OTHER): Payer: BC Managed Care – PPO | Admitting: Otolaryngology

## 2012-12-01 ENCOUNTER — Encounter (INDEPENDENT_AMBULATORY_CARE_PROVIDER_SITE_OTHER): Payer: Self-pay

## 2012-12-01 DIAGNOSIS — D37039 Neoplasm of uncertain behavior of the major salivary glands, unspecified: Secondary | ICD-10-CM

## 2012-12-07 ENCOUNTER — Encounter (HOSPITAL_BASED_OUTPATIENT_CLINIC_OR_DEPARTMENT_OTHER): Payer: Self-pay | Admitting: *Deleted

## 2012-12-07 NOTE — Progress Notes (Signed)
To bring overnight bag and all meds-no labs needed He had emergency append 8/14

## 2012-12-12 ENCOUNTER — Ambulatory Visit (HOSPITAL_BASED_OUTPATIENT_CLINIC_OR_DEPARTMENT_OTHER): Payer: BC Managed Care – PPO | Admitting: Anesthesiology

## 2012-12-12 ENCOUNTER — Encounter (HOSPITAL_BASED_OUTPATIENT_CLINIC_OR_DEPARTMENT_OTHER): Payer: Self-pay | Admitting: *Deleted

## 2012-12-12 ENCOUNTER — Encounter (HOSPITAL_BASED_OUTPATIENT_CLINIC_OR_DEPARTMENT_OTHER)
Admission: RE | Disposition: A | Discharge status: Home / Self Care | Payer: Self-pay | Source: Ambulatory Visit | Attending: Otolaryngology

## 2012-12-12 ENCOUNTER — Ambulatory Visit (HOSPITAL_BASED_OUTPATIENT_CLINIC_OR_DEPARTMENT_OTHER)
Admission: RE | Admit: 2012-12-12 | Discharge: 2012-12-13 | Disposition: A | Discharge status: Home / Self Care | Payer: BC Managed Care – PPO | Source: Ambulatory Visit | Attending: Otolaryngology | Admitting: Otolaryngology

## 2012-12-12 ENCOUNTER — Encounter (HOSPITAL_BASED_OUTPATIENT_CLINIC_OR_DEPARTMENT_OTHER): Payer: BC Managed Care – PPO | Admitting: Anesthesiology

## 2012-12-12 DIAGNOSIS — D119 Benign neoplasm of major salivary gland, unspecified: Secondary | ICD-10-CM | POA: Insufficient documentation

## 2012-12-12 DIAGNOSIS — Z9049 Acquired absence of other specified parts of digestive tract: Secondary | ICD-10-CM

## 2012-12-12 HISTORY — PX: PAROTIDECTOMY: SHX2163

## 2012-12-12 SURGERY — EXCISION, PAROTID GLAND
Anesthesia: General | Site: Face | Laterality: Right | Wound class: Clean

## 2012-12-12 MED ORDER — KCL IN DEXTROSE-NACL 20-5-0.45 MEQ/L-%-% IV SOLN
INTRAVENOUS | Status: DC
  Administered 2012-12-12: 75 mL/h via INTRAVENOUS
  Administered 2012-12-12: 12:00:00 via INTRAVENOUS

## 2012-12-12 MED ORDER — PROPOFOL 10 MG/ML IV EMUL
INTRAVENOUS | Status: AC
  Filled 2012-12-12: qty 50

## 2012-12-12 MED ORDER — HYDROMORPHONE HCL PF 1 MG/ML IJ SOLN
INTRAMUSCULAR | Status: AC
  Filled 2012-12-12: qty 1

## 2012-12-12 MED ORDER — OXYCODONE-ACETAMINOPHEN 5-325 MG PO TABS: 1.0000 | ORAL_TABLET | ORAL | Status: AC | PRN

## 2012-12-12 MED ORDER — PROMETHAZINE HCL 25 MG PO TABS: 25.0000 mg | ORAL_TABLET | Freq: Four times a day (QID) | ORAL | Status: DC | PRN

## 2012-12-12 MED ORDER — LIDOCAINE-EPINEPHRINE 1 %-1:100000 IJ SOLN
INTRAMUSCULAR | Status: DC | PRN
  Administered 2012-12-12: 4 mL

## 2012-12-12 MED ORDER — OXYCODONE-ACETAMINOPHEN 5-325 MG PO TABS
ORAL_TABLET | ORAL | Status: AC
  Filled 2012-12-12: qty 2

## 2012-12-12 MED ORDER — DEXAMETHASONE SODIUM PHOSPHATE 4 MG/ML IJ SOLN
INTRAMUSCULAR | Status: DC | PRN
  Administered 2012-12-12: 10 mg via INTRAVENOUS

## 2012-12-12 MED ORDER — KCL IN DEXTROSE-NACL 20-5-0.45 MEQ/L-%-% IV SOLN
INTRAVENOUS | Status: AC
  Filled 2012-12-12: qty 1000

## 2012-12-12 MED ORDER — FENTANYL CITRATE 0.05 MG/ML IJ SOLN
INTRAMUSCULAR | Status: AC
  Filled 2012-12-12: qty 6

## 2012-12-12 MED ORDER — LIDOCAINE HCL (CARDIAC) 20 MG/ML IV SOLN
INTRAVENOUS | Status: DC | PRN
  Administered 2012-12-12: 60 mg via INTRAVENOUS

## 2012-12-12 MED ORDER — SUCCINYLCHOLINE CHLORIDE 20 MG/ML IJ SOLN
INTRAMUSCULAR | Status: DC | PRN
  Administered 2012-12-12: 100 mg via INTRAVENOUS

## 2012-12-12 MED ORDER — MIDAZOLAM HCL 5 MG/5ML IJ SOLN
INTRAMUSCULAR | Status: DC | PRN
  Administered 2012-12-12: 2 mg via INTRAVENOUS

## 2012-12-12 MED ORDER — HYDROMORPHONE HCL PF 1 MG/ML IJ SOLN
0.2500 mg | INTRAMUSCULAR | Status: DC | PRN
  Administered 2012-12-12 (×4): 0.5 mg via INTRAVENOUS

## 2012-12-12 MED ORDER — MORPHINE SULFATE 2 MG/ML IJ SOLN
INTRAMUSCULAR | Status: AC
  Filled 2012-12-12: qty 1

## 2012-12-12 MED ORDER — OXYCODONE HCL 5 MG PO TABS: 5.0000 mg | ORAL_TABLET | Freq: Once | ORAL | Status: AC | PRN

## 2012-12-12 MED ORDER — CEFAZOLIN SODIUM-DEXTROSE 2-3 GM-% IV SOLR
INTRAVENOUS | Status: AC
  Filled 2012-12-12: qty 50

## 2012-12-12 MED ORDER — AMOXICILLIN 875 MG PO TABS: 875.0000 mg | ORAL_TABLET | Freq: Two times a day (BID) | ORAL | Status: AC

## 2012-12-12 MED ORDER — OXYCODONE-ACETAMINOPHEN 5-325 MG PO TABS
1.0000 | ORAL_TABLET | ORAL | Status: DC | PRN
  Administered 2012-12-12 – 2012-12-13 (×5): 2 via ORAL

## 2012-12-12 MED ORDER — LIDOCAINE-EPINEPHRINE 1 %-1:100000 IJ SOLN
INTRAMUSCULAR | Status: AC
  Filled 2012-12-12: qty 1

## 2012-12-12 MED ORDER — LORATADINE 10 MG PO TABS: 10.0000 mg | ORAL_TABLET | Freq: Every day | ORAL | Status: DC

## 2012-12-12 MED ORDER — ZOLPIDEM TARTRATE 5 MG PO TABS: 5.0000 mg | ORAL_TABLET | Freq: Every evening | ORAL | Status: DC | PRN

## 2012-12-12 MED ORDER — PROMETHAZINE HCL 25 MG RE SUPP: 25.0000 mg | Freq: Four times a day (QID) | RECTAL | Status: DC | PRN

## 2012-12-12 MED ORDER — ONDANSETRON HCL 4 MG/2ML IJ SOLN
INTRAMUSCULAR | Status: DC | PRN
  Administered 2012-12-12: 4 mg via INTRAMUSCULAR

## 2012-12-12 MED ORDER — PROPOFOL 10 MG/ML IV BOLUS
INTRAVENOUS | Status: DC | PRN
  Administered 2012-12-12: 150 mg via INTRAVENOUS
  Administered 2012-12-12: 50 mg via INTRAVENOUS

## 2012-12-12 MED ORDER — BACITRACIN ZINC 500 UNIT/GM EX OINT
TOPICAL_OINTMENT | CUTANEOUS | Status: AC
  Filled 2012-12-12: qty 28.35

## 2012-12-12 MED ORDER — LACTATED RINGERS IV SOLN
INTRAVENOUS | Status: DC
  Administered 2012-12-12 (×3): via INTRAVENOUS

## 2012-12-12 MED ORDER — FENTANYL CITRATE 0.05 MG/ML IJ SOLN
INTRAMUSCULAR | Status: DC | PRN
  Administered 2012-12-12: 50 ug via INTRAVENOUS
  Administered 2012-12-12: 100 ug via INTRAVENOUS
  Administered 2012-12-12: 25 ug via INTRAVENOUS
  Administered 2012-12-12: 100 ug via INTRAVENOUS
  Administered 2012-12-12: 25 ug via INTRAVENOUS

## 2012-12-12 MED ORDER — CEFAZOLIN SODIUM-DEXTROSE 2-3 GM-% IV SOLR
INTRAVENOUS | Status: DC | PRN
  Administered 2012-12-12: 2 g via INTRAVENOUS

## 2012-12-12 MED ORDER — EPHEDRINE SULFATE 50 MG/ML IJ SOLN
INTRAMUSCULAR | Status: DC | PRN
  Administered 2012-12-12 (×2): 10 mg via INTRAVENOUS

## 2012-12-12 MED ORDER — MIDAZOLAM HCL 2 MG/2ML IJ SOLN
INTRAMUSCULAR | Status: AC
  Filled 2012-12-12: qty 2

## 2012-12-12 MED ORDER — ONDANSETRON HCL 4 MG/2ML IJ SOLN: 4.0000 mg | Freq: Once | INTRAMUSCULAR | Status: AC | PRN

## 2012-12-12 MED ORDER — MORPHINE SULFATE 2 MG/ML IJ SOLN
2.0000 mg | INTRAMUSCULAR | Status: DC | PRN
  Administered 2012-12-12 – 2012-12-13 (×3): 2 mg via INTRAVENOUS

## 2012-12-12 MED ORDER — OXYCODONE HCL 5 MG/5ML PO SOLN: 5.0000 mg | Freq: Once | ORAL | Status: AC | PRN

## 2012-12-12 SURGICAL SUPPLY — 61 items
ADH SKN CLS APL DERMABOND .7 (GAUZE/BANDAGES/DRESSINGS) ×1
APL SRG 3 HI ABS STRL LF PLS (MISCELLANEOUS) ×1
APPLICATOR DR MATTHEWS STRL (MISCELLANEOUS) ×2 IMPLANT
ATTRACTOMAT 16X20 MAGNETIC DRP (DRAPES) ×2 IMPLANT
BAG DECANTER FOR FLEXI CONT (MISCELLANEOUS) IMPLANT
BALL CTTN LRG ABS STRL LF (GAUZE/BANDAGES/DRESSINGS) ×1
BANDAGE GAUZE ELAST BULKY 4 IN (GAUZE/BANDAGES/DRESSINGS) ×1 IMPLANT
BLADE SURG 15 STRL LF DISP TIS (BLADE) ×1 IMPLANT
BLADE SURG 15 STRL SS (BLADE) ×2
CANISTER SUCT 1200ML W/VALVE (MISCELLANEOUS) ×2 IMPLANT
CORDS BIPOLAR (ELECTRODE) ×2 IMPLANT
COTTONBALL LRG STERILE PKG (GAUZE/BANDAGES/DRESSINGS) ×2 IMPLANT
COVER MAYO STAND STRL (DRAPES) ×2 IMPLANT
COVER TABLE BACK 60X90 (DRAPES) ×2 IMPLANT
DECANTER SPIKE VIAL GLASS SM (MISCELLANEOUS) ×2 IMPLANT
DERMABOND ADVANCED (GAUZE/BANDAGES/DRESSINGS) ×1
DERMABOND ADVANCED .7 DNX12 (GAUZE/BANDAGES/DRESSINGS) ×1 IMPLANT
DRAIN CHANNEL 10F 3/8 F FF (DRAIN) ×1 IMPLANT
DRAPE INCISE 23X17 IOBAN STRL (DRAPES)
DRAPE INCISE 23X17 STRL (DRAPES) IMPLANT
DRAPE INCISE IOBAN 23X17 STRL (DRAPES) IMPLANT
DRAPE SURG 17X23 STRL (DRAPES) IMPLANT
DRAPE U-SHAPE 76X120 STRL (DRAPES) ×2 IMPLANT
ELECT COATED BLADE 2.86 ST (ELECTRODE) ×2 IMPLANT
ELECT PAIRED SUBDERMAL (MISCELLANEOUS) ×2
ELECT REM PT RETURN 9FT ADLT (ELECTROSURGICAL) ×2
ELECTRODE PAIRED SUBDERMAL (MISCELLANEOUS) ×1 IMPLANT
ELECTRODE REM PT RTRN 9FT ADLT (ELECTROSURGICAL) ×1 IMPLANT
EVACUATOR SILICONE 100CC (DRAIN) ×1 IMPLANT
GAUZE SPONGE 4X4 16PLY XRAY LF (GAUZE/BANDAGES/DRESSINGS) ×2 IMPLANT
GLOVE BIO SURGEON STRL SZ 6.5 (GLOVE) IMPLANT
GLOVE BIO SURGEON STRL SZ7.5 (GLOVE) ×2 IMPLANT
GOWN PREVENTION PLUS XLARGE (GOWN DISPOSABLE) ×4 IMPLANT
LOCATOR NERVE 3 VOLT (DISPOSABLE) IMPLANT
NDL HYPO 25X1 1.5 SAFETY (NEEDLE) ×1 IMPLANT
NEEDLE HYPO 25X1 1.5 SAFETY (NEEDLE) ×2 IMPLANT
NS IRRIG 1000ML POUR BTL (IV SOLUTION) ×2 IMPLANT
PACK BASIN DAY SURGERY FS (CUSTOM PROCEDURE TRAY) ×2 IMPLANT
PAD ALCOHOL SWAB (MISCELLANEOUS) ×4 IMPLANT
PENCIL BUTTON HOLSTER BLD 10FT (ELECTRODE) ×2 IMPLANT
PIN SAFETY STERILE (MISCELLANEOUS) IMPLANT
PROBE NERVBE PRASS .33 (MISCELLANEOUS) IMPLANT
SLEEVE SCD COMPRESS KNEE MED (MISCELLANEOUS) ×2 IMPLANT
SPONGE GAUZE 4X4 12PLY (GAUZE/BANDAGES/DRESSINGS) ×3 IMPLANT
SPONGE INTESTINAL PEANUT (DISPOSABLE) ×2 IMPLANT
STAPLER VISISTAT 35W (STAPLE) IMPLANT
SUT ETHILON 3 0 PS 1 (SUTURE) ×1 IMPLANT
SUT PROLENE 5 0 P 3 (SUTURE) ×2 IMPLANT
SUT SILK 2 0 FS (SUTURE) ×4 IMPLANT
SUT SILK 2 0 TIES 17X18 (SUTURE)
SUT SILK 2-0 18XBRD TIE BLK (SUTURE) IMPLANT
SUT SILK 3 0 TIES 17X18 (SUTURE) ×2
SUT SILK 3-0 18XBRD TIE BLK (SUTURE) ×1 IMPLANT
SUT SILK 4 0 TIES 17X18 (SUTURE) IMPLANT
SUT VIC AB 3-0 FS2 27 (SUTURE) IMPLANT
SUT VICRYL 4-0 PS2 18IN ABS (SUTURE) ×3 IMPLANT
SYR BULB 3OZ (MISCELLANEOUS) ×2 IMPLANT
SYR CONTROL 10ML LL (SYRINGE) ×2 IMPLANT
TOWEL OR 17X24 6PK STRL BLUE (TOWEL DISPOSABLE) ×2 IMPLANT
TRAY DSU PREP LF (CUSTOM PROCEDURE TRAY) ×2 IMPLANT
TUBE CONNECTING 20X1/4 (TUBING) ×2 IMPLANT

## 2012-12-12 NOTE — Anesthesia Postprocedure Evaluation (Signed)
  Anesthesia Post-op Note  Patient: David Adkins  Procedure(s) Performed: Procedure(s): RIGHT PAROTIDECTOMY WITH FACIAL NERVE DISSECTION  (Right)  Patient Location: PACU  Anesthesia Type:General  Level of Consciousness: awake, alert  and oriented  Airway and Oxygen Therapy: Patient Spontanous Breathing  Post-op Pain: mild  Post-op Assessment: Post-op Vital signs reviewed, Patient's Cardiovascular Status Stable, Respiratory Function Stable, Patent Airway and Pain level controlled  Post-op Vital Signs: stable  Complications: No apparent anesthesia complications

## 2012-12-12 NOTE — H&P (Signed)
H&P Update  Pt's original H&P dated 12/01/12 reviewed and placed in chart (to be scanned).  I personally examined the patient today.  No change in health. Proceed with right parotidectomy.

## 2012-12-12 NOTE — Anesthesia Preprocedure Evaluation (Addendum)
Anesthesia Evaluation  Patient identified by MRN, date of birth, ID band Patient awake    Reviewed: Allergy & Precautions, H&P , NPO status , Patient's Chart, lab work & pertinent test results  History of Anesthesia Complications Negative for: history of anesthetic complications  Airway Mallampati: II TM Distance: >3 FB Neck ROM: Full    Dental  (+) Teeth Intact   Pulmonary neg pulmonary ROS,  breath sounds clear to auscultation        Cardiovascular negative cardio ROS  Rhythm:Regular Rate:Normal     Neuro/Psych negative neurological ROS  negative psych ROS   GI/Hepatic   Endo/Other    Renal/GU      Musculoskeletal   Abdominal   Peds  Hematology   Anesthesia Other Findings   Reproductive/Obstetrics                          Anesthesia Physical Anesthesia Plan  ASA: II  Anesthesia Plan: General   Post-op Pain Management:    Induction: Intravenous  Airway Management Planned: Oral ETT  Additional Equipment:   Intra-op Plan:   Post-operative Plan: Extubation in OR  Informed Consent: I have reviewed the patients History and Physical, chart, labs and discussed the procedure including the risks, benefits and alternatives for the proposed anesthesia with the patient or authorized representative who has indicated his/her understanding and acceptance.   Dental advisory given  Plan Discussed with: CRNA and Anesthesiologist  Anesthesia Plan Comments:         Anesthesia Quick Evaluation

## 2012-12-12 NOTE — Transfer of Care (Signed)
Immediate Anesthesia Transfer of Care Note  Patient: David Adkins  Procedure(s) Performed: Procedure(s): RIGHT PAROTIDECTOMY WITH FACIAL NERVE DISSECTION  (Right)  Patient Location: PACU  Anesthesia Type:General  Level of Consciousness: sedated  Airway & Oxygen Therapy: Patient Spontanous Breathing and Patient connected to face mask oxygen  Post-op Assessment: Report given to PACU RN and Post -op Vital signs reviewed and stable  Post vital signs: Reviewed and stable  Complications: No apparent anesthesia complications

## 2012-12-12 NOTE — Brief Op Note (Signed)
12/12/2012  9:28 AM  PATIENT:  Jodi Marble  52 y.o. male  PRE-OPERATIVE DIAGNOSIS:  RIGHT PARTOID MASS   POST-OPERATIVE DIAGNOSIS:  RIGHT PARTOID MASS   PROCEDURE:  Procedure(s): RIGHT PAROTIDECTOMY WITH FACIAL NERVE DISSECTION  (Right)  SURGEON:  Surgeon(s) and Role:    * Darletta Moll, MD - Primary  PHYSICIAN ASSISTANT:   ASSISTANTS: Roma Schanz, PA-C   ANESTHESIA:   general  EBL:  Total I/O In: 1800 [I.V.:1800] Out: -   BLOOD ADMINISTERED:none  DRAINS: (#10) Jackson-Pratt drain(s) with closed bulb suction in the neck   LOCAL MEDICATIONS USED:  LIDOCAINE   SPECIMEN:  Source of Specimen:  Right parotid mass  DISPOSITION OF SPECIMEN:  PATHOLOGY  COUNTS:  YES  TOURNIQUET:  * No tourniquets in log *  DICTATION: .Other Dictation: Dictation Number [id number]  PLAN OF CARE: Admit for overnight observation  PATIENT DISPOSITION:  ICU - extubated and stable.   Delay start of Pharmacological VTE agent (>24hrs) due to surgical blood loss or risk of bleeding: not applicable

## 2012-12-12 NOTE — Anesthesia Procedure Notes (Signed)
Procedure Name: Intubation Date/Time: 12/12/2012 7:40 AM Performed by: Burna Cash Pre-anesthesia Checklist: Patient identified, Emergency Drugs available, Suction available and Patient being monitored Patient Re-evaluated:Patient Re-evaluated prior to inductionOxygen Delivery Method: Circle System Utilized Preoxygenation: Pre-oxygenation with 100% oxygen Intubation Type: IV induction Ventilation: Mask ventilation without difficulty Laryngoscope Size: Mac and 3 Grade View: Grade I Tube type: Oral Number of attempts: 1 Airway Equipment and Method: stylet and oral airway Placement Confirmation: ETT inserted through vocal cords under direct vision,  positive ETCO2 and breath sounds checked- equal and bilateral Secured at: 22 cm Tube secured with: Tape Dental Injury: Teeth and Oropharynx as per pre-operative assessment

## 2012-12-13 ENCOUNTER — Encounter (HOSPITAL_BASED_OUTPATIENT_CLINIC_OR_DEPARTMENT_OTHER): Payer: Self-pay | Admitting: Otolaryngology

## 2012-12-13 MED ORDER — MORPHINE SULFATE 2 MG/ML IJ SOLN
INTRAMUSCULAR | Status: AC
  Filled 2012-12-13: qty 1

## 2012-12-13 MED ORDER — OXYCODONE-ACETAMINOPHEN 5-325 MG PO TABS
ORAL_TABLET | ORAL | Status: AC
  Filled 2012-12-13: qty 2

## 2012-12-13 NOTE — Op Note (Signed)
NAMEJALYNN, BETZOLD NO.:  [id number]  MEDICAL RECORD NO.:  [id number]  LOCATION:                               FACILITY:  [facility]  PHYSICIAN:  Newman Pies, MD            DATE OF BIRTH:  [birth year]  DATE OF PROCEDURE:  12/12/2012 DATE OF DISCHARGE:  12/12/2012                              OPERATIVE REPORT   SURGEON:  Newman Pies, MD.  ASSISTANT:  Roma Schanz, P.A.  PREOPERATIVE DIAGNOSIS:  Right parotid mass.  POSTOPERATIVE DIAGNOSIS:  Right parotid mass.  PROCEDURE PERFORMED:  Right lateral parotidectomy with dissection and preservation of facial nerve.  ANESTHESIA:  General endotracheal tube anesthesia.  COMPLICATIONS:  None.  ESTIMATED BLOOD LOSS:  Less than 20 mL.  INDICATIONS FOR PROCEDURE:  The patient is a 52 year old male with a history of large right parotid mass.  On his CT scan, he was noted to have 3-cm heterogeneous enhancing soft tissue lesion, within the tail of the right parotid gland.  Based on the above findings, the decision was made for the patient to undergo surgical removal of the large parotid mass.  The risks, benefits, alternatives, and details of the procedure were discussed with the patient.  Questions were invited and answered. Informed consent was obtained.  DESCRIPTION OF PROCEDURE:  The patient was taken to the operating room and placed in supine on the operating table.  General endotracheal tube anesthesia was administered by the anesthesiologist.  Preop antibiotics were given.  The patient was positioned and prepped and draped in the standard fashion for right parotid surgery.  Lidocaine 1% with 1:100,000 epinephrine was injected at the planned site of incisions.  A standard facelift preauricular incision was made, curving from the preauricular area down to the right lateral neck in the standard fashion.  A SMAS flap was elevated in the standard fashion. The great auricular nerve was sacrificed in order to obtain  adequate approach to the parotid mass.  A large 3-cm tail of the right parotid mass was noted.  Dissection was carried out in the preauricular plane, freeing the parotid mass from the surrounding soft tissue.  The main trunk of the facial nerve was subsequently identified.  The facial nerve was carefully dissected free from the surrounding parotid tissue.  The nerve was traced all the way to the bifurcation.  Since the tumor was noted inferior to the facial nerve, only the inferior branch of the facial nerve was exposed and dissected out.  The tumor was then dissected free from the inferior branch of the facial nerve.  The entire tumor was then removed and sent to the Pathology for permanent histologic identification.  All branches of the facial nerve were noted to be functional throughout the case.  Hemostasis was achieved with bipolar electrocautery.  A #10 JP drain was placed.  The skin flap was then returned to its normal anatomic position.  It was closed in layers with 4-0 Vicryl and 5-0 Prolene sutures.  The care of the patient was turned over to the anesthesiologist.  The patient was awakened from anesthesia without difficulty.  He was extubated and transferred to the recovery room in  good condition.  OPERATIVE FINDINGS:  A 3-cm right tail of the parotid mass was noted. The entire tumor was removed.  The facial nerve was identified and preserved.  SPECIMEN:  Right parotid mass.  FOLLOWUP CARE:  The patient will be observed overnight in the hospital. He will most likely be discharged home on postop day #1.     Newman Pies, MD   ______________________________ Newman Pies, MD    ST/MEDQ  D:  12/12/2012  T:  12/12/2012  Job:  [id number]  cc:   Madelin Rear. Sherwood Gambler, MD

## 2012-12-13 NOTE — Discharge Summary (Signed)
Physician Discharge Summary  Patient ID: David Adkins MRN: [id number] DOB/AGE: [birth year] 51 y.o.  Admit date: 12/12/2012 Discharge date: 12/13/2012  Admission Diagnoses: Right parotid mass  Discharge Diagnoses: Right parotid mass Active Problems:   * No active hospital problems. *   Discharged Condition: good  Hospital Course: Pt had an uneventful overnight stay. Pt tolerated po well. No bleeding. No stridor. No facial nerve weakness.  Consults: None  Significant Diagnostic Studies: None  Treatments: surgery: Right parotidectomy  Discharge Exam: Blood pressure 107/72, pulse 76, temperature 97.8 F (36.6 C), temperature source Oral, resp. rate 16, height 5\' 9"  (1.753 m), weight 80.74 kg (178 lb), SpO2 96.00%. Incision/Wound:C/D/I Facial nerve function intact bilaterally.  Disposition: 01-Home or Self Care  Discharge Orders   Future Orders Complete By Expires   Diet general  As directed    Increase activity slowly  As directed        Medication List    STOP taking these medications       ibuprofen 800 MG tablet  Commonly known as:  ADVIL,MOTRIN      TAKE these medications       amoxicillin 875 MG tablet  Commonly known as:  AMOXIL  Take 1 tablet (875 mg total) by mouth 2 (two) times daily.     calcium carbonate 500 MG chewable tablet  Commonly known as:  TUMS - dosed in mg elemental calcium  Chew 1 tablet by mouth daily.     loratadine 10 MG tablet  Commonly known as:  CLARITIN  Take 10 mg by mouth daily.     multivitamin with minerals tablet  Take 1 tablet by mouth daily.     oxyCODONE-acetaminophen 5-325 MG per tablet  Commonly known as:  ROXICET  Take 1 tablet by mouth every 4 (four) hours as needed for pain.           Follow-up Information   Follow up with DR Julieann Drummonds [facility] Eron Staat In 1 week. (as scheduled)    Contact information:   [address] [place] [address]       Signed: Darletta Moll 12/13/2012, 7:33 AM

## 2012-12-22 ENCOUNTER — Ambulatory Visit (INDEPENDENT_AMBULATORY_CARE_PROVIDER_SITE_OTHER): Payer: BC Managed Care – PPO | Admitting: Otolaryngology

## 2013-01-05 ENCOUNTER — Encounter (INDEPENDENT_AMBULATORY_CARE_PROVIDER_SITE_OTHER): Payer: Self-pay

## 2013-01-05 ENCOUNTER — Ambulatory Visit (INDEPENDENT_AMBULATORY_CARE_PROVIDER_SITE_OTHER): Payer: BC Managed Care – PPO | Admitting: Otolaryngology

## 2013-07-06 ENCOUNTER — Ambulatory Visit (INDEPENDENT_AMBULATORY_CARE_PROVIDER_SITE_OTHER): Payer: BC Managed Care – PPO | Admitting: Otolaryngology

## 2013-08-03 ENCOUNTER — Ambulatory Visit (INDEPENDENT_AMBULATORY_CARE_PROVIDER_SITE_OTHER): Payer: BC Managed Care – PPO | Admitting: Otolaryngology

## 2013-08-03 DIAGNOSIS — K114 Fistula of salivary gland: Secondary | ICD-10-CM

## 2014-01-30 ENCOUNTER — Telehealth: Payer: Self-pay

## 2014-01-30 NOTE — Telephone Encounter (Signed)
Pt called this afternoon saying he had received a letter from DS. Please return call [phone]

## 2014-02-06 NOTE — Telephone Encounter (Signed)
Gastroenterology Pre-Procedure Review  Request Date: Requesting Physician: Fusco  PATIENT REVIEW QUESTIONS: The patient responded to the following health history questions as indicated:    1. Diabetes Melitis: NO 2. Joint replacements in the past 12 months: NO 3. Major health problems in the past 3 months: NO 4. Has an artificial valve or MVP: ON 5. Has a defibrillator: NO 6. Has been advised in past to take antibiotics in advance of a procedure like teeth cleaning: NO 7.Family History Colon Cancer: NO 8. Alcohol: NO 9. No problems at this time      MEDICATIONS & ALLERGIES:    Patient reports the following regarding taking any blood thinners:   Plavix? NO Aspirin? NO Coumadin? NO  Patient confirms/reports the following medications:  Current Outpatient Prescriptions  Medication Sig Dispense Refill  . calcium carbonate (TUMS - DOSED IN MG ELEMENTAL CALCIUM) 500 MG chewable tablet Chew 1 tablet by mouth daily.    Marland Kitchen loratadine (CLARITIN) 10 MG tablet Take 10 mg by mouth daily.    . Multiple Vitamins-Minerals (MULTIVITAMIN WITH MINERALS) tablet Take 1 tablet by mouth daily.    Marland Kitchen oxyCODONE-acetaminophen (ROXICET) 5-325 MG per tablet Take 1 tablet by mouth every 4 (four) hours as needed for pain. 30 tablet 0   No current facility-administered medications for this visit.    Patient confirms/reports the following allergies:  No Known Allergies  No orders of the defined types were placed in this encounter.    AUTHORIZATION INFORMATION Primary Insurance: [place],  [place] #: [id number]  Group #: [id number] Pre-Cert / Josem Kaufmann required: Pre-Cert / Auth #:    SCHEDULE INFORMATION: Procedure has been scheduled as follows:  Date: , Time:  Location:   This Gastroenterology Pre-Precedure Review Form is being routed to the following provider(s):   Does not matter who does TCS

## 2014-02-12 ENCOUNTER — Other Ambulatory Visit: Payer: Self-pay

## 2014-02-12 DIAGNOSIS — Z1211 Encounter for screening for malignant neoplasm of colon: Secondary | ICD-10-CM

## 2014-02-12 MED ORDER — PEG-KCL-NACL-NASULF-NA ASC-C 100 G PO SOLR: 1.0000 | ORAL | Status: AC

## 2014-02-12 NOTE — Telephone Encounter (Signed)
OK to schedule

## 2014-02-12 NOTE — Telephone Encounter (Signed)
Pt is set up for 02/20/14 with RMR at 9:15. Pt is aware and instructions are in the mail. I also call [employer] and talked with LaToya and she said no PA was needed

## 2014-02-20 ENCOUNTER — Encounter (HOSPITAL_COMMUNITY)
Admission: RE | Disposition: A | Discharge status: Home / Self Care | Payer: Self-pay | Source: Ambulatory Visit | Attending: Internal Medicine

## 2014-02-20 ENCOUNTER — Ambulatory Visit (HOSPITAL_COMMUNITY)
Admission: RE | Admit: 2014-02-20 | Discharge: 2014-02-20 | Disposition: A | Discharge status: Home / Self Care | Payer: BC Managed Care – PPO | Source: Ambulatory Visit | Attending: Internal Medicine | Admitting: Internal Medicine

## 2014-02-20 DIAGNOSIS — Z5309 Procedure and treatment not carried out because of other contraindication: Secondary | ICD-10-CM

## 2014-02-20 DIAGNOSIS — Z87891 Personal history of nicotine dependence: Secondary | ICD-10-CM | POA: Diagnosis not present

## 2014-02-20 DIAGNOSIS — Z1211 Encounter for screening for malignant neoplasm of colon: Secondary | ICD-10-CM | POA: Diagnosis present

## 2014-02-20 DIAGNOSIS — K219 Gastro-esophageal reflux disease without esophagitis: Secondary | ICD-10-CM | POA: Insufficient documentation

## 2014-02-20 DIAGNOSIS — K5730 Diverticulosis of large intestine without perforation or abscess without bleeding: Secondary | ICD-10-CM | POA: Diagnosis not present

## 2014-02-20 HISTORY — PX: COLONOSCOPY: SHX5424

## 2014-02-20 SURGERY — COLONOSCOPY
Anesthesia: Moderate Sedation

## 2014-02-20 MED ORDER — MEPERIDINE HCL 100 MG/ML IJ SOLN
INTRAMUSCULAR | Status: AC
  Filled 2014-02-20: qty 2

## 2014-02-20 MED ORDER — STERILE WATER FOR IRRIGATION IR SOLN
Status: DC | PRN
  Administered 2014-02-20: 09:00:00

## 2014-02-20 MED ORDER — MEPERIDINE HCL 100 MG/ML IJ SOLN
INTRAMUSCULAR | Status: DC | PRN
  Administered 2014-02-20: 25 mg via INTRAVENOUS
  Administered 2014-02-20 (×2): 50 mg via INTRAVENOUS

## 2014-02-20 MED ORDER — SODIUM CHLORIDE 0.9 % IV SOLN
INTRAVENOUS | Status: DC
Start: 2014-02-20 — End: 2014-02-20

## 2014-02-20 MED ORDER — ONDANSETRON HCL 4 MG/2ML IJ SOLN
INTRAMUSCULAR | Status: AC
  Filled 2014-02-20: qty 2

## 2014-02-20 MED ORDER — MIDAZOLAM HCL 5 MG/5ML IJ SOLN
INTRAMUSCULAR | Status: AC
  Filled 2014-02-20: qty 10

## 2014-02-20 MED ORDER — ONDANSETRON HCL 4 MG/2ML IJ SOLN
INTRAMUSCULAR | Status: DC | PRN
  Administered 2014-02-20: 4 mg via INTRAVENOUS

## 2014-02-20 MED ORDER — MIDAZOLAM HCL 5 MG/5ML IJ SOLN
INTRAMUSCULAR | Status: DC | PRN
  Administered 2014-02-20: 2 mg via INTRAVENOUS
  Administered 2014-02-20: 1 mg via INTRAVENOUS
  Administered 2014-02-20: 2 mg via INTRAVENOUS
  Administered 2014-02-20: 1 mg via INTRAVENOUS

## 2014-02-20 NOTE — Progress Notes (Signed)
Awake. Connected to monitors. ECG displays bigeminy of PAC's. Strip mounted. Dr Gala Romney notified and viewed ECG strip. Order given for ECG.

## 2014-02-20 NOTE — Op Note (Signed)
[facility] [address] [place], [address]   COLONOSCOPY PROCEDURE REPORT  PATIENT: David Adkins, David Adkins  MR#: [id number] BIRTHDATE: August 21, 1960 , 19  yrs. old GENDER: male ENDOSCOPIST: R.  Garfield Cornea, MD FACP [facility] REFERRED OM:AYOKH Gerarda Fraction, M.D. PROCEDURE DATE:  2014/02/24 PROCEDURE:   Colonoscopy, screening (incomplete) - patient admits to noncompliance with preparation INDICATIONS:First ever average risk colorectal cancer screening examination. MEDICATIONS: Versed 6 mg IV and Demerol 125 mg IV in divided doses. Zofran 4 mg IV. ASA CLASS:       Class II  CONSENT: The risks, benefits, alternatives and imponderables including but not limited to bleeding, perforation as well as the possibility of a missed lesion have been reviewed.  The potential for biopsy, lesion removal, etc. have also been discussed. Questions have been answered.  All parties agreeable.  Please see the history and physical in the medical record for more information.  DESCRIPTION OF PROCEDURE:   After the risks benefits and alternatives of the procedure were thoroughly explained, informed consent was obtained.  The digital rectal exam      The EC-[id number] ([id number])  endoscope was introduced through the anus and advanced to the descending colon. No adverse events experienced.   The quality of the prep was inadequate.  The instrument was then slowly withdrawn as the colon was examined.      COLON FINDINGS: Formed stool in the rectum trailing up into the descending colon precluded complete examination of the colon.  The segments of the rectal mucosa seen appeared normal.  Patient had sigmoid diverticula.  Large areas of formed stool in the lumen of the left colon precluded advancement and completion of the examination today.     .  Withdrawal time=  . cecum not reached. The scope was withdrawn and the procedure completed. COMPLICATIONS: There were no immediate complications.  ENDOSCOPIC  IMPRESSION: Screening colonoscopy?"incomplete as described above. PVCs and PACs; see note prior to the procedure.  RECOMMENDATIONS: Patient should follow-up with Dr. Gerarda Fraction regarding PVCs and PACs seen today. We will need to regroup and reattempt screening colonoscopy the first of the year.  eSigned:  R. Garfield Cornea, MD Rosalita Chessman [facility] 02/24/14 9:47 AM   cc:  CPT CODES: ICD CODES:  The ICD and CPT codes recommended by this software are interpretations from the data that the clinical staff has captured with the software.  The verification of the translation of this report to the ICD and CPT codes and modifiers is the sole responsibility of the health care institution and practicing physician where this report was generated.  [employer] will not be held responsible for the validity of the ICD and CPT codes included on this report.  AMA assumes no liability for data contained or not contained herein. CPT is a Designer, television/film set of the [employer].  PATIENT NAME:  David Adkins, David Adkins MR#: [id number]

## 2014-02-20 NOTE — Progress Notes (Signed)
Unable to complete colonoscopy due to formed stool

## 2014-02-20 NOTE — Discharge Instructions (Signed)
Colonoscopy, Care After Refer to this sheet in the next few weeks. These instructions provide you with information on caring for yourself after your procedure. Your health care provider may also give you more specific instructions. Your treatment has been planned according to current medical practices, but problems sometimes occur. Call your health care provider if you have any problems or questions after your procedure. WHAT TO EXPECT AFTER THE PROCEDURE  After your procedure, it is typical to have the following: A small amount of blood in your stool. Moderate amounts of gas and mild abdominal cramping or bloating. HOME CARE INSTRUCTIONS Do not drive, operate machinery, or sign important documents for 24 hours. You may shower and resume your regular physical activities, but move at a slower pace for the first 24 hours. Take frequent rest periods for the first 24 hours. Walk around or put a warm pack on your abdomen to help reduce abdominal cramping and bloating. Drink enough fluids to keep your urine clear or pale yellow. You may resume your normal diet as instructed by your health care provider. Avoid heavy or fried foods that are hard to digest. Avoid drinking alcohol for 24 hours or as instructed by your health care provider. Only take over-the-counter or prescription medicines as directed by your health care provider. If a tissue sample (biopsy) was taken during your procedure: Do not take aspirin or blood thinners for 7 days, or as instructed by your health care provider. Do not drink alcohol for 7 days, or as instructed by your health care provider. Eat soft foods for the first 24 hours. SEEK MEDICAL CARE IF: You have persistent spotting of blood in your stool 2-3 days after the procedure. SEEK IMMEDIATE MEDICAL CARE IF: You have more than a small spotting of blood in your stool. You pass large blood clots in your stool. Your abdomen is swollen (distended). You have nausea or  vomiting. You have a fever. You have increasing abdominal pain that is not relieved with medicine. Document Released: 09/24/2003 Document Revised: 11/30/2012 Document Reviewed: 10/17/2012 [facility] Patient Information 2015 [place], [place]. This information is not intended to replace advice given to you by your health care provider. Make sure you discuss any questions you have with your health care provider. Colonoscopy A colonoscopy is an exam to look at the entire large intestine (colon). This exam can help find problems such as tumors, polyps, inflammation, and areas of bleeding. The exam takes about 1 hour.  LET [facility] CARE PROVIDER KNOW ABOUT:   Any allergies you have.  All medicines you are taking, including vitamins, herbs, eye drops, creams, and over-the-counter medicines.  Previous problems you or members of your family have had with the use of anesthetics.  Any blood disorders you have.  Previous surgeries you have had.  Medical conditions you have. RISKS AND COMPLICATIONS  Generally, this is a safe procedure. However, as with any procedure, complications can occur. Possible complications include:  Bleeding.  Tearing or rupture of the colon wall.  Reaction to medicines given during the exam.  Infection (rare). BEFORE THE PROCEDURE   Ask your health care provider about changing or stopping your regular medicines.  You may be prescribed an oral bowel prep. This involves drinking a large amount of medicated liquid, starting the day before your procedure. The liquid will cause you to have multiple loose stools until your stool is almost clear or light green. This cleans out your colon in preparation for the procedure.  Do not eat or  drink anything else once you have started the bowel prep, unless your health care provider tells you it is safe to do so.  Arrange for someone to drive you home after the procedure. PROCEDURE   You will be given medicine to help you relax  (sedative).  You will lie on your side with your knees bent.  A long, flexible tube with a light and camera on the end (colonoscope) will be inserted through the rectum and into the colon. The camera sends video back to a computer screen as it moves through the colon. The colonoscope also releases carbon dioxide gas to inflate the colon. This helps your health care provider see the area better.  During the exam, your health care provider may take a small tissue sample (biopsy) to be examined under a microscope if any abnormalities are found.  The exam is finished when the entire colon has been viewed. AFTER THE PROCEDURE   Do not drive for 24 hours after the exam.  You may have a small amount of blood in your stool.  You may pass moderate amounts of gas and have mild abdominal cramping or bloating. This is caused by the gas used to inflate your colon during the exam.  Ask when your test results will be ready and how you will get your results. Make sure you get your test results. Document Released: 02/07/2000 Document Revised: 11/30/2012 Document Reviewed: 10/17/2012 [facility] Patient Information 2015 [place], [place]. This information is not intended to replace advice given to you by your health care provider. Make sure you discuss any questions you have with your health care provider.    Colonoscopy Discharge Instructions  Read the instructions outlined below and refer to this sheet in the next few weeks. These discharge instructions provide you with general information on caring for yourself after you leave the hospital. Your doctor may also give you specific instructions. While your treatment has been planned according to the most current medical practices available, unavoidable complications occasionally occur. If you have any problems or questions after discharge, call Dr. Gala Romney at [phone]. ACTIVITY  You may resume your regular activity, but move at a slower pace for the next 24  hours.   Take frequent rest periods for the next 24 hours.   Walking will help get rid of the air and reduce the bloated feeling in your belly (abdomen).   No driving for 24 hours (because of the medicine (anesthesia) used during the test).    Do not sign any important legal documents or operate any machinery for 24 hours (because of the anesthesia used during the test).  NUTRITION  Drink plenty of fluids.   You may resume your normal diet as instructed by your doctor.   Begin with a light meal and progress to your normal diet. Heavy or fried foods are harder to digest and may make you feel sick to your stomach (nauseated).   Avoid alcoholic beverages for 24 hours or as instructed.  MEDICATIONS  You may resume your normal medications unless your doctor tells you otherwise.  WHAT YOU CAN EXPECT TODAY  Some feelings of bloating in the abdomen.   Passage of more gas than usual.   Spotting of blood in your stool or on the toilet paper.  IF YOU HAD POLYPS REMOVED DURING THE COLONOSCOPY:  No aspirin products for 7 days or as instructed.   No alcohol for 7 days or as instructed.   Eat a soft diet for the next 24  hours.  FINDING OUT THE RESULTS OF YOUR TEST Not all test results are available during your visit. If your test results are not back during the visit, make an appointment with your caregiver to find out the results. Do not assume everything is normal if you have not heard from your caregiver or the medical facility. It is important for you to follow up on all of your test results.  SEEK IMMEDIATE MEDICAL ATTENTION IF:  You have more than a spotting of blood in your stool.   Your belly is swollen (abdominal distention).   You are nauseated or vomiting.   You have a temperature over 101.   You have abdominal pain or discomfort that is severe or gets worse throughout the day.   Your colonoscopy today was incomplete because you were poorly prepped.  As discussed, see  Dr. Gerarda Fraction regarding abnormal EKG and irregular heartbeat  My office will schedule a follow-up appointment with you in about 6 weeks to set up another colonoscopy.

## 2014-02-20 NOTE — Progress Notes (Signed)
Awake. Denies chest pain and shortness of breath. Denies nausea. Skin warm and dry. States he has never had an ECG and that he just had a check-up with Dr Gerarda Fraction.

## 2014-02-20 NOTE — H&P (Signed)
_0 @   Primary Care Physician:  Glo Herring., MD Primary Gastroenterologist:  Dr. Gala Romney  Pre-Procedure History & Physical: HPI:  David Adkins is a 53 y.o. male is here for a screening colonoscopy. First ever colonoscopy. No bowel symptoms. No family history of colon cancer.  Past Medical History  Diagnosis Date  . GERD (gastroesophageal reflux disease)     Past Surgical History  Procedure Laterality Date  . Cyst excision Left     cheek  . Laparoscopic appendectomy N/A 09/30/2012    Procedure: APPENDECTOMY LAPAROSCOPIC;  Surgeon: Jamesetta So, MD;  Location: [facility];  Service: General;  Laterality: N/A;  . Eye surgery Left     age 73-strabismus  . Parotidectomy Right 12/12/2012    Procedure: RIGHT PAROTIDECTOMY WITH FACIAL NERVE DISSECTION ;  Surgeon: Ascencion Dike, MD;  Location: [place];  Service: ENT;  Laterality: Right;    Prior to Admission medications   Medication Sig Start Date End Date Taking? Authorizing Provider  calcium carbonate (TUMS - DOSED IN MG ELEMENTAL CALCIUM) 500 MG chewable tablet Chew 1 tablet by mouth daily as needed for indigestion or heartburn.    Yes Historical Provider, MD  HYDROcodone-acetaminophen (NORCO) 10-325 MG per tablet Take 1 tablet by mouth every 4 (four) hours as needed. 01/15/14  Yes Historical Provider, MD  loratadine (CLARITIN) 10 MG tablet Take 10 mg by mouth daily.   Yes Historical Provider, MD  Multiple Vitamins-Minerals (MULTIVITAMIN WITH MINERALS) tablet Take 1 tablet by mouth daily.   Yes Historical Provider, MD  peg 3350 powder (MOVIPREP) 100 G SOLR Take 1 kit (200 g total) by mouth as directed. 02/12/14  Yes Daneil Dolin, MD  oxyCODONE-acetaminophen (ROXICET) 5-325 MG per tablet Take 1 tablet by mouth every 4 (four) hours as needed for pain. Patient not taking: Reported on 02/19/2014 12/12/12   Ascencion Dike, MD    Allergies as of 02/12/2014  . (No Known Allergies)    No family history on  file.  History   Social History  . Marital Status: Divorced    Spouse Name: N/A    Number of Children: N/A  . Years of Education: N/A   Occupational History  . Not on file.   Social History Main Topics  . Smoking status: Former Smoker    Types: Cigarettes    Quit date: 04/02/2012  . Smokeless tobacco: Never Used  . Alcohol Use: Yes     Comment: social drinker   . Drug Use: No  . Sexual Activity: Yes   Other Topics Concern  . Not on file   Social History Narrative  . No narrative on file    Review of Systems: See HPI, otherwise negative ROS  Physical Exam: BP 123/83 mmHg  Pulse 60  Temp(Src) 97.6 F (36.4 C) (Oral)  Resp 16  SpO2 97% General:   Alert,  Well-developed, well-nourished, pleasant and cooperative in NAD Head:  Normocephalic and atraumatic. Eyes:  Sclera clear, no icterus.   Conjunctiva pink. Ears:  Normal auditory acuity. Nose:  No deformity, discharge,  or lesions. Mouth:  No deformity or lesions, dentition normal. Neck:  Supple; no masses or thyromegaly. Lungs:  Clear throughout to auscultation.   No wheezes, crackles, or rhonchi. No acute distress. Heart:  Regular rate and rhythm; no murmurs, clicks, rubs,  or gallops. Abdomen:  Soft, nontender and nondistended. No masses, hepatosplenomegaly or hernias noted. Normal bowel sounds, without guarding, and without rebound.   Msk:  Symmetrical  without gross deformities. Normal posture. Pulses:  Normal pulses noted. Extremities:  Without clubbing or edema. Neurologic:  Alert and  oriented x4;  grossly normal neurologically. Skin:  Intact without significant lesions or rashes. Cervical Nodes:  No significant cervical adenopathy. Psych:  Alert and cooperative. Normal mood and affect.  Impression/Plan: David Adkins is now here to undergo a screening colonoscopy.  First ever average her screening examination. PACs and PVCs noted on the monitor. Confirm with the 12-lead EKG. Patient without symptoms.  No prior history of heart disease. Will monitor closely throughout the procedure.  Risks, benefits, limitations, imponderables and alternatives regarding colonoscopy have been reviewed with the patient. Questions have been answered. All parties agreeable.     Notice:  This dictation was prepared with Dragon dictation along with smaller phrase technology. Any transcriptional errors that result from this process are unintentional and may not be corrected upon review.

## 2014-02-20 NOTE — Progress Notes (Signed)
ECG done. Dr Gala Romney viewed ECG. No further orders given. Will proceed with procedure.

## 2014-02-21 ENCOUNTER — Encounter (HOSPITAL_COMMUNITY): Payer: Self-pay | Admitting: Internal Medicine

## 2014-04-05 ENCOUNTER — Ambulatory Visit: Payer: BC Managed Care – PPO | Admitting: Gastroenterology

## 2014-07-19 ENCOUNTER — Ambulatory Visit (INDEPENDENT_AMBULATORY_CARE_PROVIDER_SITE_OTHER): Payer: 59 | Admitting: Otolaryngology

## 2014-07-19 DIAGNOSIS — K114 Fistula of salivary gland: Secondary | ICD-10-CM

## 2014-09-19 IMAGING — CT CT ABD-PELV W/ CM
2 of 4 series · 16 of 46 positions shown, 18 images · IV contrast (Omnipaque 300)
Comparison: None.

CLINICAL DATA: Pelvic pain.  Nausea and vomiting.

CT ABDOMEN AND PELVIS WITH CONTRAST
TECHNIQUE: Multidetector CT imaging of the abdomen and pelvis was
performed following the standard protocol during bolus
administration of intravenous contrast.
Contrast: 50mL OMNIPAQUE IOHEXOL 300 MG/ML  SOLN, 100mL OMNIPAQUE
IOHEXOL 300 MG/ML  SOLN

[Series 2: abd_pel_with 5.0 b40f · axial · 0.72mm/px · z∈[-514,-94]mm · 13 of 92 slices shown, 15 images]
[im 4/92  soft-tissue]
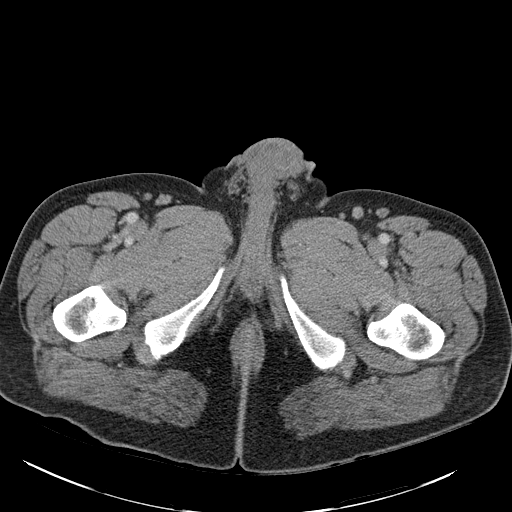
[im 4/92  bone]
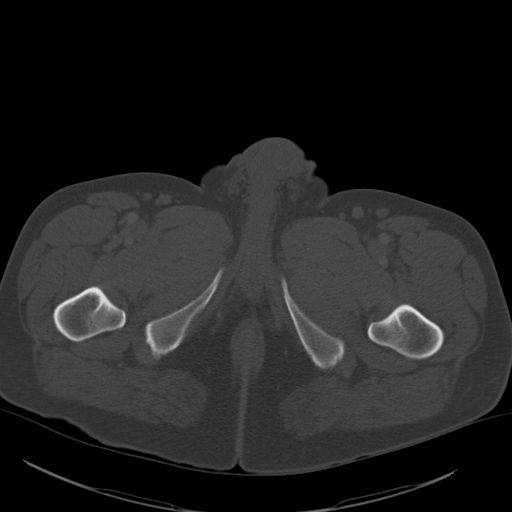
[im 12/92  soft-tissue]
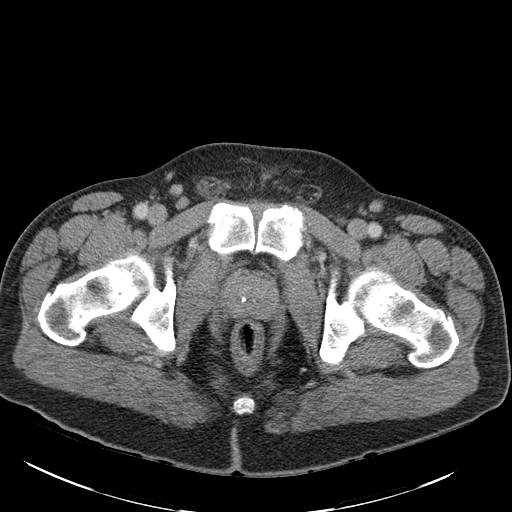
[im 20/92  soft-tissue]
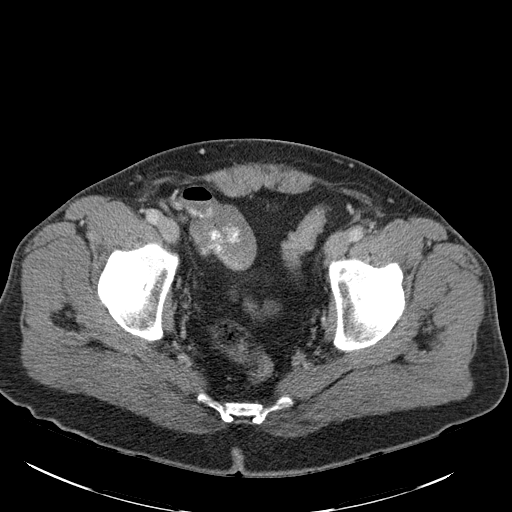
[im 24/92  soft-tissue]
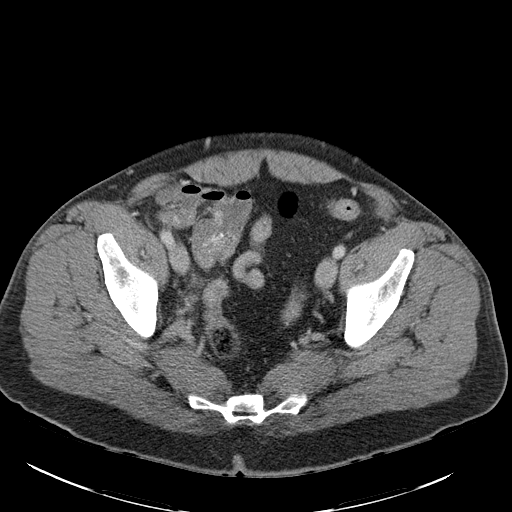
[im 32/92  soft-tissue]
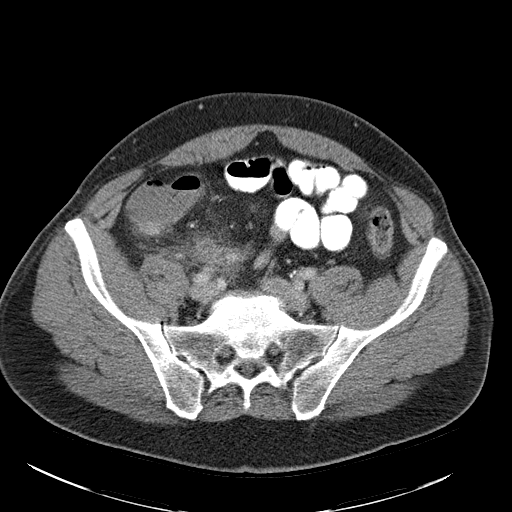
[im 40/92  soft-tissue]
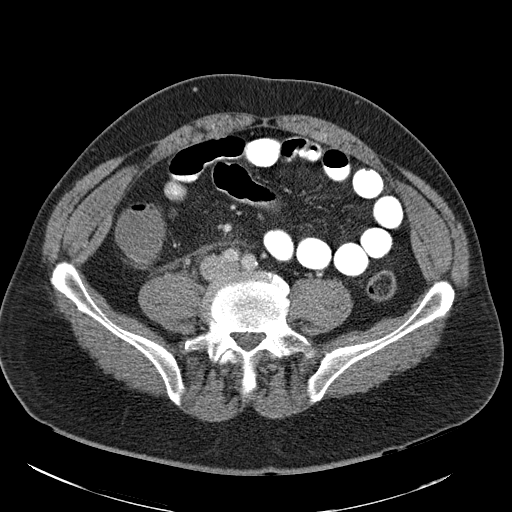
[im 48/92  soft-tissue]
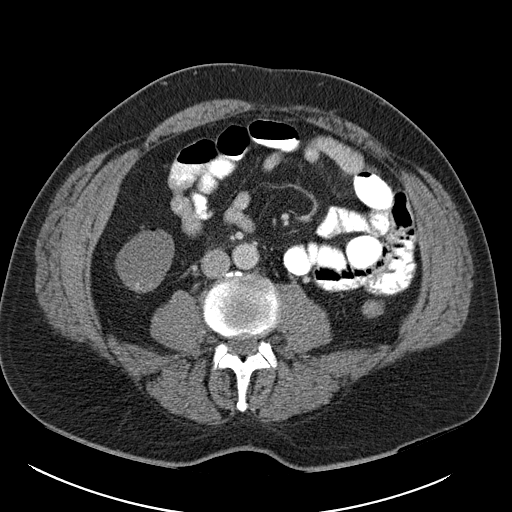
[im 52/92  soft-tissue]
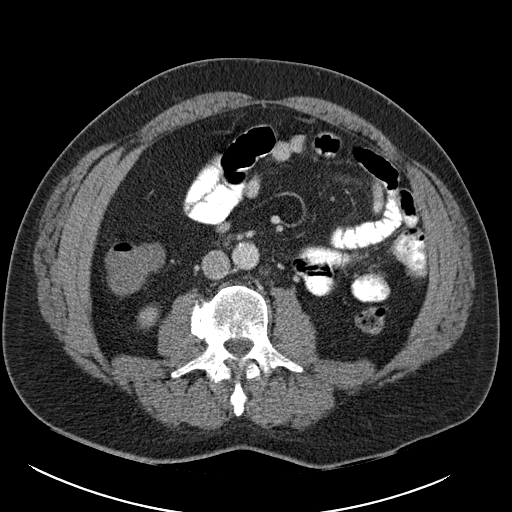
[im 60/92  soft-tissue]
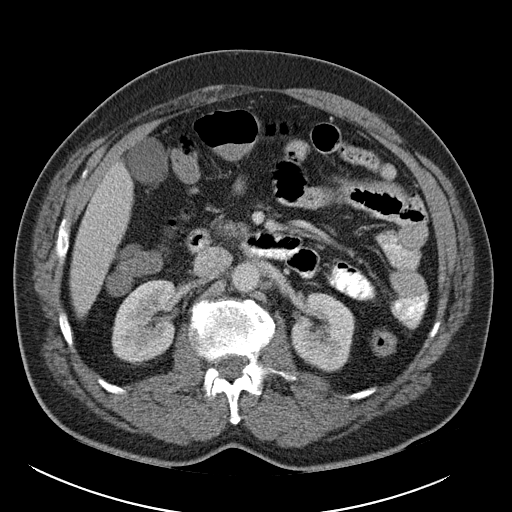
[im 60/92  bone]
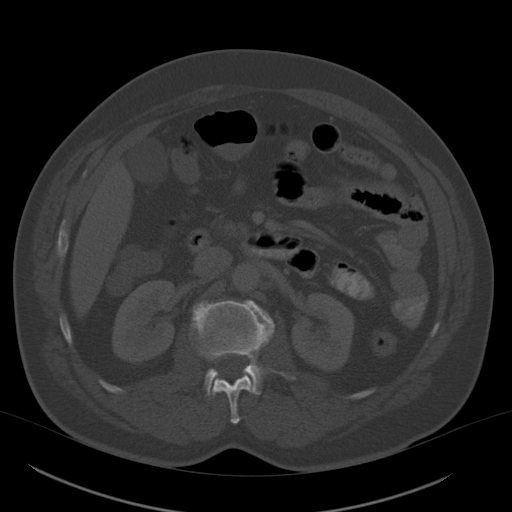
[im 68/92  soft-tissue]
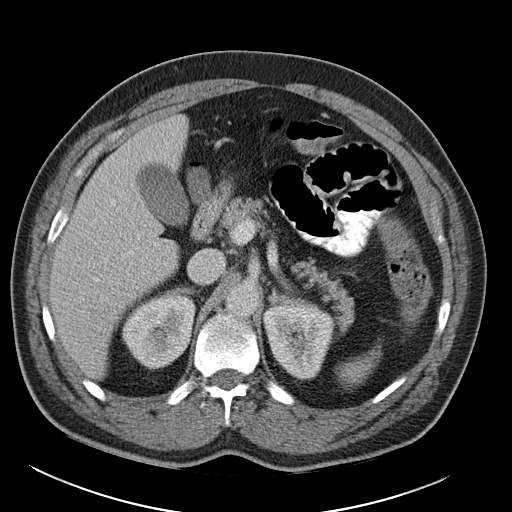
[im 72/92  soft-tissue]
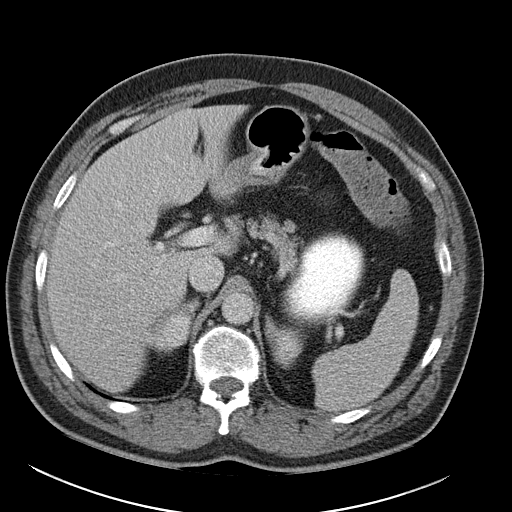
[im 80/92  soft-tissue]
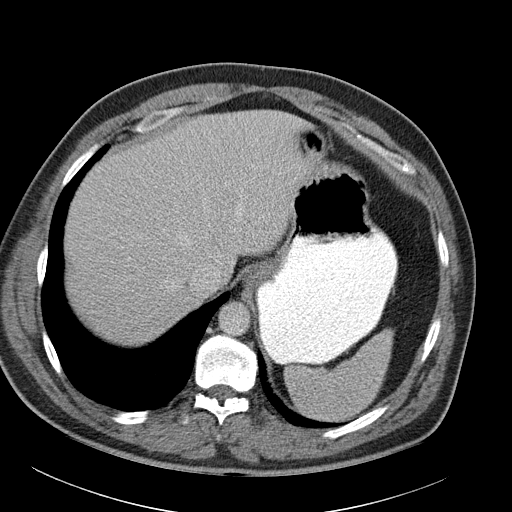
[im 88/92  soft-tissue]
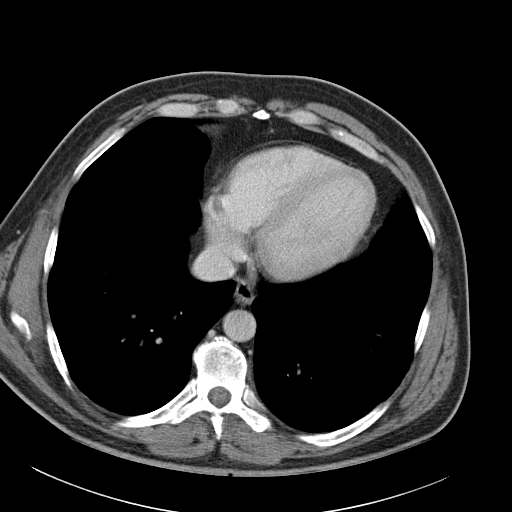

[Series 4: abd_pel_with 3.0 spo cor · coronal · 0.81mm/px · 3 of 94 slices shown]
[im 32/94  soft-tissue]
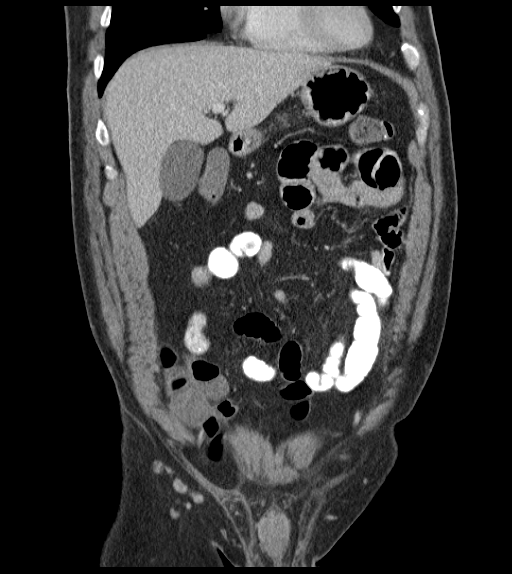
[im 42/94  soft-tissue]
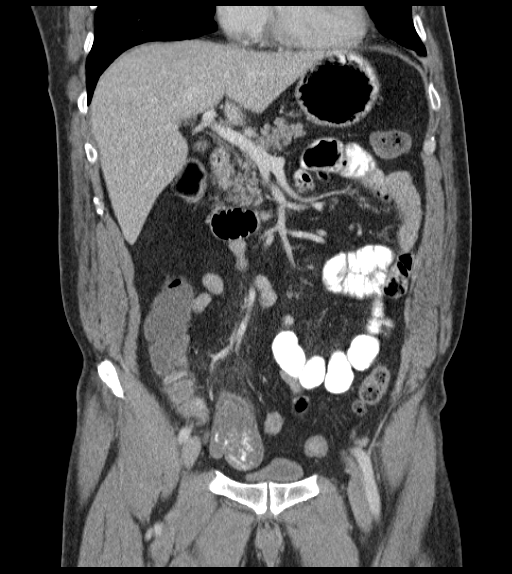
[im 52/94  soft-tissue]
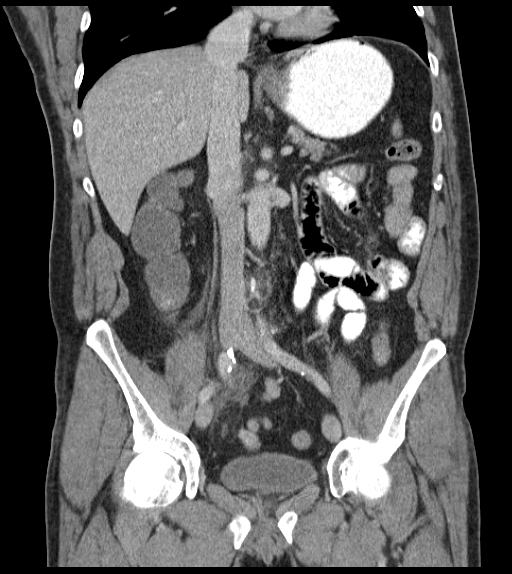

[16 of 46 positions shown; findings below may reference images not displayed]

FINDINGS: Subsegmental atelectasis or scarring noted in the left
lower lobe.

The liver, spleen, pancreas, and adrenal glands appear
unremarkable.

The gallbladder and biliary system appear unremarkable.

The kidneys appear unremarkable, as do the proximal ureters.

Acute appendicitis is observed with appendiceal diameter 1.3 cm and
with considerable periappendiceal stranding.  I do not observe
extraluminal gas or a drainable abscess.  High density in the
appendix may represent appendicoliths.

Small right inguinal hernia contains adipose tissue.  Mild
aortoiliac atherosclerotic vascular disease noted.  No free pelvic
fluid.  Urinary bladder unremarkable.

Lumbar spondylosis and degenerative disc disease observed.
IMPRESSION: 1.  Acute appendicitis, appendiceal diameter 1.3 cm, with
considerable periappendiceal stranding.  No extraluminal gas or
drainable abscess.
2.  Small right inguinal hernia contains adipose tissue.
3.  Mild atherosclerosis.
4.  Lumbar spondylosis and degenerative disc disease.

These results will be called to the ordering clinician or
representative by the Radiologist Assistant, and communication
documented in the PACS Dashboard.

## 2017-03-02 DIAGNOSIS — M75102 Unspecified rotator cuff tear or rupture of left shoulder, not specified as traumatic: Secondary | ICD-10-CM | POA: Diagnosis not present

## 2017-03-02 DIAGNOSIS — E663 Overweight: Secondary | ICD-10-CM | POA: Diagnosis not present

## 2017-03-02 DIAGNOSIS — Z1389 Encounter for screening for other disorder: Secondary | ICD-10-CM | POA: Diagnosis not present

## 2017-03-02 DIAGNOSIS — Z6829 Body mass index (BMI) 29.0-29.9, adult: Secondary | ICD-10-CM | POA: Diagnosis not present

## 2017-03-11 DIAGNOSIS — Z23 Encounter for immunization: Secondary | ICD-10-CM | POA: Diagnosis not present

## 2017-03-11 DIAGNOSIS — Z1389 Encounter for screening for other disorder: Secondary | ICD-10-CM | POA: Diagnosis not present

## 2017-03-11 DIAGNOSIS — Z0000 Encounter for general adult medical examination without abnormal findings: Secondary | ICD-10-CM | POA: Diagnosis not present

## 2017-03-16 DIAGNOSIS — G894 Chronic pain syndrome: Secondary | ICD-10-CM | POA: Diagnosis not present

## 2017-03-16 DIAGNOSIS — M130 Polyarthritis, unspecified: Secondary | ICD-10-CM | POA: Diagnosis not present

## 2017-03-16 DIAGNOSIS — M1991 Primary osteoarthritis, unspecified site: Secondary | ICD-10-CM | POA: Diagnosis not present

## 2017-03-16 DIAGNOSIS — Z6829 Body mass index (BMI) 29.0-29.9, adult: Secondary | ICD-10-CM | POA: Diagnosis not present

## 2017-03-16 DIAGNOSIS — Z1389 Encounter for screening for other disorder: Secondary | ICD-10-CM | POA: Diagnosis not present

## 2017-03-18 DIAGNOSIS — M1991 Primary osteoarthritis, unspecified site: Secondary | ICD-10-CM | POA: Diagnosis not present

## 2017-03-18 DIAGNOSIS — Z1389 Encounter for screening for other disorder: Secondary | ICD-10-CM | POA: Diagnosis not present

## 2017-03-18 DIAGNOSIS — D72829 Elevated white blood cell count, unspecified: Secondary | ICD-10-CM | POA: Diagnosis not present

## 2017-04-27 DIAGNOSIS — H5213 Myopia, bilateral: Secondary | ICD-10-CM | POA: Diagnosis not present

## 2017-04-27 DIAGNOSIS — Z961 Presence of intraocular lens: Secondary | ICD-10-CM | POA: Diagnosis not present

## 2017-04-27 DIAGNOSIS — H25093 Other age-related incipient cataract, bilateral: Secondary | ICD-10-CM | POA: Diagnosis not present

## 2017-06-11 DIAGNOSIS — M2550 Pain in unspecified joint: Secondary | ICD-10-CM | POA: Diagnosis not present

## 2017-06-11 DIAGNOSIS — J449 Chronic obstructive pulmonary disease, unspecified: Secondary | ICD-10-CM | POA: Diagnosis not present

## 2017-06-11 DIAGNOSIS — Z6828 Body mass index (BMI) 28.0-28.9, adult: Secondary | ICD-10-CM | POA: Diagnosis not present

## 2017-06-11 DIAGNOSIS — M1991 Primary osteoarthritis, unspecified site: Secondary | ICD-10-CM | POA: Diagnosis not present

## 2017-06-11 DIAGNOSIS — Z1389 Encounter for screening for other disorder: Secondary | ICD-10-CM | POA: Diagnosis not present

## 2017-09-09 DIAGNOSIS — L7452 Secondary focal hyperhidrosis: Secondary | ICD-10-CM | POA: Diagnosis not present

## 2017-09-09 DIAGNOSIS — Z6828 Body mass index (BMI) 28.0-28.9, adult: Secondary | ICD-10-CM | POA: Diagnosis not present

## 2017-09-09 DIAGNOSIS — M159 Polyosteoarthritis, unspecified: Secondary | ICD-10-CM | POA: Diagnosis not present

## 2017-09-09 DIAGNOSIS — E663 Overweight: Secondary | ICD-10-CM | POA: Diagnosis not present

## 2017-09-09 DIAGNOSIS — Z0001 Encounter for general adult medical examination with abnormal findings: Secondary | ICD-10-CM | POA: Diagnosis not present

## 2017-09-09 DIAGNOSIS — D110 Benign neoplasm of parotid gland: Secondary | ICD-10-CM | POA: Diagnosis not present

## 2017-09-09 DIAGNOSIS — G894 Chronic pain syndrome: Secondary | ICD-10-CM | POA: Diagnosis not present

## 2017-12-09 DIAGNOSIS — E663 Overweight: Secondary | ICD-10-CM | POA: Diagnosis not present

## 2017-12-09 DIAGNOSIS — Z23 Encounter for immunization: Secondary | ICD-10-CM | POA: Diagnosis not present

## 2017-12-09 DIAGNOSIS — F419 Anxiety disorder, unspecified: Secondary | ICD-10-CM | POA: Diagnosis not present

## 2017-12-09 DIAGNOSIS — G894 Chronic pain syndrome: Secondary | ICD-10-CM | POA: Diagnosis not present

## 2017-12-09 DIAGNOSIS — Z1389 Encounter for screening for other disorder: Secondary | ICD-10-CM | POA: Diagnosis not present

## 2017-12-09 DIAGNOSIS — N529 Male erectile dysfunction, unspecified: Secondary | ICD-10-CM | POA: Diagnosis not present

## 2017-12-09 DIAGNOSIS — Z6828 Body mass index (BMI) 28.0-28.9, adult: Secondary | ICD-10-CM | POA: Diagnosis not present

## 2017-12-09 DIAGNOSIS — M1991 Primary osteoarthritis, unspecified site: Secondary | ICD-10-CM | POA: Diagnosis not present

## 2019-03-20 DIAGNOSIS — G894 Chronic pain syndrome: Secondary | ICD-10-CM | POA: Diagnosis not present

## 2019-03-20 DIAGNOSIS — M159 Polyosteoarthritis, unspecified: Secondary | ICD-10-CM | POA: Diagnosis not present

## 2019-03-20 DIAGNOSIS — Z6829 Body mass index (BMI) 29.0-29.9, adult: Secondary | ICD-10-CM | POA: Diagnosis not present

## 2019-03-20 DIAGNOSIS — E663 Overweight: Secondary | ICD-10-CM | POA: Diagnosis not present

## 2019-04-14 DIAGNOSIS — R69 Illness, unspecified: Secondary | ICD-10-CM | POA: Diagnosis not present

## 2019-04-14 DIAGNOSIS — Z6829 Body mass index (BMI) 29.0-29.9, adult: Secondary | ICD-10-CM | POA: Diagnosis not present

## 2019-04-14 DIAGNOSIS — G894 Chronic pain syndrome: Secondary | ICD-10-CM | POA: Diagnosis not present

## 2019-04-14 DIAGNOSIS — J449 Chronic obstructive pulmonary disease, unspecified: Secondary | ICD-10-CM | POA: Diagnosis not present

## 2019-05-11 DIAGNOSIS — R69 Illness, unspecified: Secondary | ICD-10-CM | POA: Diagnosis not present

## 2019-05-11 DIAGNOSIS — G894 Chronic pain syndrome: Secondary | ICD-10-CM | POA: Diagnosis not present

## 2019-05-11 DIAGNOSIS — Z6828 Body mass index (BMI) 28.0-28.9, adult: Secondary | ICD-10-CM | POA: Diagnosis not present

## 2019-05-11 DIAGNOSIS — M159 Polyosteoarthritis, unspecified: Secondary | ICD-10-CM | POA: Diagnosis not present

## 2019-05-11 DIAGNOSIS — E663 Overweight: Secondary | ICD-10-CM | POA: Diagnosis not present

## 2019-05-11 DIAGNOSIS — Z1389 Encounter for screening for other disorder: Secondary | ICD-10-CM | POA: Diagnosis not present

## 2019-06-08 DIAGNOSIS — M1991 Primary osteoarthritis, unspecified site: Secondary | ICD-10-CM | POA: Diagnosis not present

## 2019-06-08 DIAGNOSIS — G894 Chronic pain syndrome: Secondary | ICD-10-CM | POA: Diagnosis not present

## 2019-06-08 DIAGNOSIS — R69 Illness, unspecified: Secondary | ICD-10-CM | POA: Diagnosis not present

## 2019-06-08 DIAGNOSIS — Z6828 Body mass index (BMI) 28.0-28.9, adult: Secondary | ICD-10-CM | POA: Diagnosis not present

## 2019-06-30 DIAGNOSIS — L03211 Cellulitis of face: Secondary | ICD-10-CM | POA: Diagnosis not present

## 2019-06-30 DIAGNOSIS — E663 Overweight: Secondary | ICD-10-CM | POA: Diagnosis not present

## 2019-06-30 DIAGNOSIS — G894 Chronic pain syndrome: Secondary | ICD-10-CM | POA: Diagnosis not present

## 2019-06-30 DIAGNOSIS — Z6828 Body mass index (BMI) 28.0-28.9, adult: Secondary | ICD-10-CM | POA: Diagnosis not present

## 2019-06-30 DIAGNOSIS — M1991 Primary osteoarthritis, unspecified site: Secondary | ICD-10-CM | POA: Diagnosis not present

## 2019-06-30 DIAGNOSIS — L0201 Cutaneous abscess of face: Secondary | ICD-10-CM | POA: Diagnosis not present

## 2019-06-30 DIAGNOSIS — J449 Chronic obstructive pulmonary disease, unspecified: Secondary | ICD-10-CM | POA: Diagnosis not present

## 2019-08-07 DIAGNOSIS — E663 Overweight: Secondary | ICD-10-CM | POA: Diagnosis not present

## 2019-08-07 DIAGNOSIS — G894 Chronic pain syndrome: Secondary | ICD-10-CM | POA: Diagnosis not present

## 2019-08-07 DIAGNOSIS — Z6828 Body mass index (BMI) 28.0-28.9, adult: Secondary | ICD-10-CM | POA: Diagnosis not present

## 2019-08-07 DIAGNOSIS — M1991 Primary osteoarthritis, unspecified site: Secondary | ICD-10-CM | POA: Diagnosis not present

## 2019-10-06 DIAGNOSIS — G894 Chronic pain syndrome: Secondary | ICD-10-CM | POA: Diagnosis not present

## 2019-10-06 DIAGNOSIS — E663 Overweight: Secondary | ICD-10-CM | POA: Diagnosis not present

## 2019-10-06 DIAGNOSIS — Z6828 Body mass index (BMI) 28.0-28.9, adult: Secondary | ICD-10-CM | POA: Diagnosis not present

## 2019-10-06 DIAGNOSIS — M1991 Primary osteoarthritis, unspecified site: Secondary | ICD-10-CM | POA: Diagnosis not present

## 2019-10-24 DIAGNOSIS — J449 Chronic obstructive pulmonary disease, unspecified: Secondary | ICD-10-CM | POA: Diagnosis not present

## 2019-10-24 DIAGNOSIS — G894 Chronic pain syndrome: Secondary | ICD-10-CM | POA: Diagnosis not present

## 2019-10-24 DIAGNOSIS — R69 Illness, unspecified: Secondary | ICD-10-CM | POA: Diagnosis not present

## 2019-10-24 DIAGNOSIS — Z720 Tobacco use: Secondary | ICD-10-CM | POA: Diagnosis not present

## 2019-11-06 DIAGNOSIS — M1991 Primary osteoarthritis, unspecified site: Secondary | ICD-10-CM | POA: Diagnosis not present

## 2019-11-06 DIAGNOSIS — J449 Chronic obstructive pulmonary disease, unspecified: Secondary | ICD-10-CM | POA: Diagnosis not present

## 2019-11-06 DIAGNOSIS — Z6828 Body mass index (BMI) 28.0-28.9, adult: Secondary | ICD-10-CM | POA: Diagnosis not present

## 2019-11-06 DIAGNOSIS — G894 Chronic pain syndrome: Secondary | ICD-10-CM | POA: Diagnosis not present

## 2019-11-23 DIAGNOSIS — J449 Chronic obstructive pulmonary disease, unspecified: Secondary | ICD-10-CM | POA: Diagnosis not present

## 2019-11-23 DIAGNOSIS — M159 Polyosteoarthritis, unspecified: Secondary | ICD-10-CM | POA: Diagnosis not present

## 2019-11-23 DIAGNOSIS — E6609 Other obesity due to excess calories: Secondary | ICD-10-CM | POA: Diagnosis not present

## 2019-11-23 DIAGNOSIS — G894 Chronic pain syndrome: Secondary | ICD-10-CM | POA: Diagnosis not present

## 2019-12-11 DIAGNOSIS — G894 Chronic pain syndrome: Secondary | ICD-10-CM | POA: Diagnosis not present

## 2019-12-11 DIAGNOSIS — M1991 Primary osteoarthritis, unspecified site: Secondary | ICD-10-CM | POA: Diagnosis not present

## 2019-12-11 DIAGNOSIS — N529 Male erectile dysfunction, unspecified: Secondary | ICD-10-CM | POA: Diagnosis not present

## 2019-12-11 DIAGNOSIS — J449 Chronic obstructive pulmonary disease, unspecified: Secondary | ICD-10-CM | POA: Diagnosis not present

## 2019-12-11 DIAGNOSIS — Z6828 Body mass index (BMI) 28.0-28.9, adult: Secondary | ICD-10-CM | POA: Diagnosis not present

## 2019-12-23 DIAGNOSIS — G894 Chronic pain syndrome: Secondary | ICD-10-CM | POA: Diagnosis not present

## 2019-12-23 DIAGNOSIS — E6609 Other obesity due to excess calories: Secondary | ICD-10-CM | POA: Diagnosis not present

## 2019-12-23 DIAGNOSIS — J449 Chronic obstructive pulmonary disease, unspecified: Secondary | ICD-10-CM | POA: Diagnosis not present

## 2019-12-23 DIAGNOSIS — M159 Polyosteoarthritis, unspecified: Secondary | ICD-10-CM | POA: Diagnosis not present

## 2020-01-11 DIAGNOSIS — E039 Hypothyroidism, unspecified: Secondary | ICD-10-CM | POA: Diagnosis not present

## 2020-01-11 DIAGNOSIS — Z6828 Body mass index (BMI) 28.0-28.9, adult: Secondary | ICD-10-CM | POA: Diagnosis not present

## 2020-01-11 DIAGNOSIS — Z1331 Encounter for screening for depression: Secondary | ICD-10-CM | POA: Diagnosis not present

## 2020-01-11 DIAGNOSIS — E663 Overweight: Secondary | ICD-10-CM | POA: Diagnosis not present

## 2020-01-11 DIAGNOSIS — Z0001 Encounter for general adult medical examination with abnormal findings: Secondary | ICD-10-CM | POA: Diagnosis not present

## 2020-01-11 DIAGNOSIS — Z125 Encounter for screening for malignant neoplasm of prostate: Secondary | ICD-10-CM | POA: Diagnosis not present

## 2020-01-11 DIAGNOSIS — Z1389 Encounter for screening for other disorder: Secondary | ICD-10-CM | POA: Diagnosis not present

## 2020-01-11 DIAGNOSIS — G894 Chronic pain syndrome: Secondary | ICD-10-CM | POA: Diagnosis not present

## 2020-01-11 DIAGNOSIS — M159 Polyosteoarthritis, unspecified: Secondary | ICD-10-CM | POA: Diagnosis not present

## 2020-01-23 DIAGNOSIS — E6609 Other obesity due to excess calories: Secondary | ICD-10-CM | POA: Diagnosis not present

## 2020-01-23 DIAGNOSIS — J449 Chronic obstructive pulmonary disease, unspecified: Secondary | ICD-10-CM | POA: Diagnosis not present

## 2020-01-23 DIAGNOSIS — G894 Chronic pain syndrome: Secondary | ICD-10-CM | POA: Diagnosis not present

## 2020-01-23 DIAGNOSIS — M159 Polyosteoarthritis, unspecified: Secondary | ICD-10-CM | POA: Diagnosis not present

## 2020-02-02 DIAGNOSIS — Z6829 Body mass index (BMI) 29.0-29.9, adult: Secondary | ICD-10-CM | POA: Diagnosis not present

## 2020-02-02 DIAGNOSIS — G894 Chronic pain syndrome: Secondary | ICD-10-CM | POA: Diagnosis not present

## 2020-02-02 DIAGNOSIS — M1991 Primary osteoarthritis, unspecified site: Secondary | ICD-10-CM | POA: Diagnosis not present

## 2020-02-02 DIAGNOSIS — J449 Chronic obstructive pulmonary disease, unspecified: Secondary | ICD-10-CM | POA: Diagnosis not present

## 2020-03-04 DIAGNOSIS — J449 Chronic obstructive pulmonary disease, unspecified: Secondary | ICD-10-CM | POA: Diagnosis not present

## 2020-03-04 DIAGNOSIS — G894 Chronic pain syndrome: Secondary | ICD-10-CM | POA: Diagnosis not present

## 2020-03-04 DIAGNOSIS — M1991 Primary osteoarthritis, unspecified site: Secondary | ICD-10-CM | POA: Diagnosis not present

## 2020-03-23 DIAGNOSIS — G894 Chronic pain syndrome: Secondary | ICD-10-CM | POA: Diagnosis not present

## 2020-03-23 DIAGNOSIS — M159 Polyosteoarthritis, unspecified: Secondary | ICD-10-CM | POA: Diagnosis not present

## 2020-03-23 DIAGNOSIS — J449 Chronic obstructive pulmonary disease, unspecified: Secondary | ICD-10-CM | POA: Diagnosis not present

## 2020-03-23 DIAGNOSIS — E6609 Other obesity due to excess calories: Secondary | ICD-10-CM | POA: Diagnosis not present

## 2020-04-04 DIAGNOSIS — G894 Chronic pain syndrome: Secondary | ICD-10-CM | POA: Diagnosis not present

## 2020-04-04 DIAGNOSIS — R69 Illness, unspecified: Secondary | ICD-10-CM | POA: Diagnosis not present

## 2020-04-04 DIAGNOSIS — M1991 Primary osteoarthritis, unspecified site: Secondary | ICD-10-CM | POA: Diagnosis not present

## 2020-04-22 DIAGNOSIS — E6609 Other obesity due to excess calories: Secondary | ICD-10-CM | POA: Diagnosis not present

## 2020-04-22 DIAGNOSIS — M159 Polyosteoarthritis, unspecified: Secondary | ICD-10-CM | POA: Diagnosis not present

## 2020-04-22 DIAGNOSIS — J449 Chronic obstructive pulmonary disease, unspecified: Secondary | ICD-10-CM | POA: Diagnosis not present

## 2020-04-22 DIAGNOSIS — G894 Chronic pain syndrome: Secondary | ICD-10-CM | POA: Diagnosis not present

## 2020-05-09 DIAGNOSIS — J449 Chronic obstructive pulmonary disease, unspecified: Secondary | ICD-10-CM | POA: Diagnosis not present

## 2020-05-09 DIAGNOSIS — Z6828 Body mass index (BMI) 28.0-28.9, adult: Secondary | ICD-10-CM | POA: Diagnosis not present

## 2020-05-09 DIAGNOSIS — M1991 Primary osteoarthritis, unspecified site: Secondary | ICD-10-CM | POA: Diagnosis not present

## 2020-05-09 DIAGNOSIS — G894 Chronic pain syndrome: Secondary | ICD-10-CM | POA: Diagnosis not present

## 2020-06-06 DIAGNOSIS — G894 Chronic pain syndrome: Secondary | ICD-10-CM | POA: Diagnosis not present

## 2020-06-06 DIAGNOSIS — M1991 Primary osteoarthritis, unspecified site: Secondary | ICD-10-CM | POA: Diagnosis not present

## 2020-07-09 DIAGNOSIS — M19071 Primary osteoarthritis, right ankle and foot: Secondary | ICD-10-CM | POA: Diagnosis not present

## 2020-07-09 DIAGNOSIS — G894 Chronic pain syndrome: Secondary | ICD-10-CM | POA: Diagnosis not present

## 2020-07-09 DIAGNOSIS — Z0000 Encounter for general adult medical examination without abnormal findings: Secondary | ICD-10-CM | POA: Diagnosis not present

## 2020-07-09 DIAGNOSIS — E7849 Other hyperlipidemia: Secondary | ICD-10-CM | POA: Diagnosis not present

## 2020-07-09 DIAGNOSIS — E039 Hypothyroidism, unspecified: Secondary | ICD-10-CM | POA: Diagnosis not present

## 2020-07-09 DIAGNOSIS — Z1389 Encounter for screening for other disorder: Secondary | ICD-10-CM | POA: Diagnosis not present

## 2020-07-09 DIAGNOSIS — J449 Chronic obstructive pulmonary disease, unspecified: Secondary | ICD-10-CM | POA: Diagnosis not present

## 2020-07-23 DIAGNOSIS — G894 Chronic pain syndrome: Secondary | ICD-10-CM | POA: Diagnosis not present

## 2020-07-23 DIAGNOSIS — M159 Polyosteoarthritis, unspecified: Secondary | ICD-10-CM | POA: Diagnosis not present

## 2020-07-23 DIAGNOSIS — J449 Chronic obstructive pulmonary disease, unspecified: Secondary | ICD-10-CM | POA: Diagnosis not present

## 2020-07-26 DIAGNOSIS — G894 Chronic pain syndrome: Secondary | ICD-10-CM | POA: Diagnosis not present

## 2020-07-26 DIAGNOSIS — S43401A Unspecified sprain of right shoulder joint, initial encounter: Secondary | ICD-10-CM | POA: Diagnosis not present

## 2020-08-09 DIAGNOSIS — Z789 Other specified health status: Secondary | ICD-10-CM | POA: Diagnosis not present

## 2020-08-09 DIAGNOSIS — G894 Chronic pain syndrome: Secondary | ICD-10-CM | POA: Diagnosis not present

## 2020-08-09 DIAGNOSIS — J449 Chronic obstructive pulmonary disease, unspecified: Secondary | ICD-10-CM | POA: Diagnosis not present

## 2020-08-09 DIAGNOSIS — M1991 Primary osteoarthritis, unspecified site: Secondary | ICD-10-CM | POA: Diagnosis not present

## 2020-08-30 DIAGNOSIS — M1991 Primary osteoarthritis, unspecified site: Secondary | ICD-10-CM | POA: Diagnosis not present

## 2020-08-30 DIAGNOSIS — G894 Chronic pain syndrome: Secondary | ICD-10-CM | POA: Diagnosis not present

## 2020-10-03 DIAGNOSIS — J449 Chronic obstructive pulmonary disease, unspecified: Secondary | ICD-10-CM | POA: Diagnosis not present

## 2020-10-03 DIAGNOSIS — M1991 Primary osteoarthritis, unspecified site: Secondary | ICD-10-CM | POA: Diagnosis not present

## 2020-10-03 DIAGNOSIS — G894 Chronic pain syndrome: Secondary | ICD-10-CM | POA: Diagnosis not present

## 2020-11-01 DIAGNOSIS — M1991 Primary osteoarthritis, unspecified site: Secondary | ICD-10-CM | POA: Diagnosis not present

## 2020-11-01 DIAGNOSIS — G894 Chronic pain syndrome: Secondary | ICD-10-CM | POA: Diagnosis not present

## 2020-12-09 DIAGNOSIS — M159 Polyosteoarthritis, unspecified: Secondary | ICD-10-CM | POA: Diagnosis not present

## 2020-12-09 DIAGNOSIS — G894 Chronic pain syndrome: Secondary | ICD-10-CM | POA: Diagnosis not present

## 2021-02-06 DIAGNOSIS — M159 Polyosteoarthritis, unspecified: Secondary | ICD-10-CM | POA: Diagnosis not present

## 2021-02-06 DIAGNOSIS — G894 Chronic pain syndrome: Secondary | ICD-10-CM | POA: Diagnosis not present

## 2021-04-07 DIAGNOSIS — M159 Polyosteoarthritis, unspecified: Secondary | ICD-10-CM | POA: Diagnosis not present

## 2021-04-07 DIAGNOSIS — G894 Chronic pain syndrome: Secondary | ICD-10-CM | POA: Diagnosis not present

## 2021-04-07 DIAGNOSIS — J449 Chronic obstructive pulmonary disease, unspecified: Secondary | ICD-10-CM | POA: Diagnosis not present

## 2021-04-18 ENCOUNTER — Encounter (HOSPITAL_COMMUNITY): Payer: Self-pay

## 2021-04-18 ENCOUNTER — Other Ambulatory Visit: Payer: Self-pay

## 2021-04-18 ENCOUNTER — Emergency Department (HOSPITAL_COMMUNITY): Payer: Medicare Other

## 2021-04-18 ENCOUNTER — Emergency Department (HOSPITAL_COMMUNITY)
Admission: EM | Admit: 2021-04-18 | Discharge: 2021-04-18 | Disposition: A | Discharge status: Home / Self Care | Payer: Medicare Other | Attending: Emergency Medicine | Admitting: Emergency Medicine

## 2021-04-18 DIAGNOSIS — X501XXA Overexertion from prolonged static or awkward postures, initial encounter: Secondary | ICD-10-CM | POA: Diagnosis not present

## 2021-04-18 DIAGNOSIS — R1032 Left lower quadrant pain: Secondary | ICD-10-CM | POA: Insufficient documentation

## 2021-04-18 DIAGNOSIS — R109 Unspecified abdominal pain: Secondary | ICD-10-CM

## 2021-04-18 DIAGNOSIS — R1011 Right upper quadrant pain: Secondary | ICD-10-CM | POA: Diagnosis not present

## 2021-04-18 LAB — CBC
HCT: 43.8 % (ref 39.0–52.0)
Hemoglobin: 14.5 g/dL (ref 13.0–17.0)
MCH: 31 pg (ref 26.0–34.0)
MCHC: 33.1 g/dL (ref 30.0–36.0)
MCV: 93.8 fL (ref 80.0–100.0)
Platelets: 311 10*3/uL (ref 150–400)
RBC: 4.67 MIL/uL (ref 4.22–5.81)
RDW: 13.7 % (ref 11.5–15.5)
WBC: 12.4 10*3/uL — ABNORMAL HIGH (ref 4.0–10.5)
nRBC: 0 % (ref 0.0–0.2)

## 2021-04-18 LAB — COMPREHENSIVE METABOLIC PANEL
ALT: 20 U/L (ref 0–44)
AST: 16 U/L (ref 15–41)
Albumin: 4.3 g/dL (ref 3.5–5.0)
Alkaline Phosphatase: 68 U/L (ref 38–126)
Anion gap: 8 (ref 5–15)
BUN: 20 mg/dL (ref 6–20)
CO2: 29 mmol/L (ref 22–32)
Calcium: 9.3 mg/dL (ref 8.9–10.3)
Chloride: 101 mmol/L (ref 98–111)
Creatinine, Ser: 1.31 mg/dL — ABNORMAL HIGH (ref 0.61–1.24)
GFR, Estimated: >60 mL/min (ref >60)
Glucose, Bld: 98 mg/dL (ref 70–99)
Potassium: 4.4 mmol/L (ref 3.5–5.1)
Sodium: 138 mmol/L (ref 135–145)
Total Bilirubin: 0.5 mg/dL (ref 0.3–1.2)
Total Protein: 7.4 g/dL (ref 6.5–8.1)

## 2021-04-18 LAB — URINALYSIS, ROUTINE W REFLEX MICROSCOPIC
Bilirubin Urine: NEGATIVE
Glucose, UA: NEGATIVE mg/dL
Hgb urine dipstick: NEGATIVE
Ketones, ur: NEGATIVE mg/dL
Leukocytes,Ua: NEGATIVE
Nitrite: NEGATIVE
Protein, ur: NEGATIVE mg/dL
Specific Gravity, Urine: >1.03 — ABNORMAL HIGH (ref 1.005–1.030)
pH: 5.5 (ref 5.0–8.0)

## 2021-04-18 LAB — LIPASE, BLOOD: Lipase: 23 U/L (ref 11–51)

## 2021-04-18 MED ORDER — IOHEXOL 300 MG/ML  SOLN
100.0000 mL | Freq: Once | INTRAMUSCULAR | Status: AC | PRN
  Administered 2021-04-18: 100 mL via INTRAVENOUS

## 2021-04-18 NOTE — ED Triage Notes (Signed)
Pt to er, pt states that he has some abd pain and swelling, states that he noticed it when he bent over in the shower and also when he bent over to clip his toe nails, states that he can feel something swelling.

## 2021-04-18 NOTE — ED Provider Notes (Signed)
[facility] EMERGENCY DEPARTMENT Provider Note   CSN: [id number] Arrival date & time: 04/18/21  1256     History  Chief Complaint  Patient presents with   Abdominal Pain    David Adkins is a 61 y.o. male presenting Emergency Department with abdominal pain.  Patient reports that he began having sharp pain in the left lower quadrant right upper quadrant of the abdomen yesterday when he bent over in the shower.  The pain returned again and he bent over to clipping his toenails today.  He said he has had these symptoms in the past, very sporadically, and it is generally stabbing pain that goes away.  It is not associated with eating.  It is not associated with any kind of movements.  He is currently asymptomatic.  He denies nausea or vomiting, diarrhea or constipation.  He denies dysuria.  He does report a history of an appendectomy, no other abdominal surgeries.  HPI     Home Medications Prior to Admission medications   Medication Sig Start Date End Date Taking? Authorizing Provider  calcium carbonate (TUMS - DOSED IN MG ELEMENTAL CALCIUM) 500 MG chewable tablet Chew 1 tablet by mouth daily as needed for indigestion or heartburn.     [provider]  HYDROcodone-acetaminophen (NORCO) 10-325 MG per tablet Take 1 tablet by mouth every 4 (four) hours as needed. 01/15/14   [provider]  loratadine (CLARITIN) 10 MG tablet Take 10 mg by mouth daily.    [provider]  Multiple Vitamins-Minerals (MULTIVITAMIN WITH MINERALS) tablet Take 1 tablet by mouth daily.    [provider]  oxyCODONE-acetaminophen (ROXICET) 5-325 MG per tablet Take 1 tablet by mouth every 4 (four) hours as needed for pain. Patient not taking: Reported on 02/19/2014 12/12/12   Leta Baptist, MD  peg 3350 powder (MOVIPREP) 100 G SOLR Take 1 kit (200 g total) by mouth as directed. 02/12/14   Rourk, Cristopher Estimable, MD      Allergies    Patient has no known allergies.    Review of Systems    Review of Systems  Physical Exam Updated Vital Signs BP (!) 145/99    Pulse 82    Temp 97.9 F (36.6 C) (Oral)    Resp 16    Ht '5\' 7"'  (1.702 m)    Wt 93.9 kg    SpO2 97%    BMI 32.42 kg/m  Physical Exam Constitutional:      General: He is not in acute distress. HENT:     Head: Normocephalic and atraumatic.  Eyes:     Conjunctiva/sclera: Conjunctivae normal.     Pupils: Pupils are equal, round, and reactive to light.  Cardiovascular:     Rate and Rhythm: Normal rate and regular rhythm.  Pulmonary:     Effort: Pulmonary effort is normal. No respiratory distress.  Abdominal:     General: There is no distension.     Tenderness: There is no abdominal tenderness. There is no guarding or rebound. Negative signs include Murphy's sign and McBurney's sign.  Skin:    General: Skin is warm and dry.  Neurological:     General: No focal deficit present.     Mental Status: He is alert. Mental status is at baseline.  Psychiatric:        Mood and Affect: Mood normal.        Behavior: Behavior normal.    ED Results / Procedures / Treatments   Labs (all labs ordered  are listed, but only abnormal results are displayed) Labs Reviewed  COMPREHENSIVE METABOLIC PANEL - Abnormal; Notable for the following components:      Result Value   Creatinine, Ser 1.31 (*)    All other components within normal limits  CBC - Abnormal; Notable for the following components:   WBC 12.4 (*)    All other components within normal limits  URINALYSIS, ROUTINE W REFLEX MICROSCOPIC - Abnormal; Notable for the following components:   Specific Gravity, Urine >1.030 (*)    All other components within normal limits  LIPASE, BLOOD    EKG None  Radiology CT ABDOMEN PELVIS W CONTRAST  Result Date: 04/18/2021 CLINICAL DATA:  Acute intermittent abdominal pain left lower quadrant and left upper quadrant. EXAM: CT ABDOMEN AND PELVIS WITH CONTRAST TECHNIQUE: Multidetector CT imaging of the abdomen and pelvis was  performed using the standard protocol following bolus administration of intravenous contrast. RADIATION DOSE REDUCTION: This exam was performed according to the departmental dose-optimization program which includes automated exposure control, adjustment of the mA and/or kV according to patient size and/or use of iterative reconstruction technique. CONTRAST:  154m OMNIPAQUE IOHEXOL 300 MG/ML  SOLN COMPARISON:  09/30/2012 FINDINGS: Lower chest: Lung bases are clear. Hepatobiliary: Liver, gallbladder and biliary tree are normal. Pancreas: Normal. Spleen: Normal. Adrenals/Urinary Tract: Adrenal glands are normal. Kidneys are normal in size without hydronephrosis or nephrolithiasis. Ureters and bladder are normal. Stomach/Bowel: Stomach and small bowel are normal. Previous appendectomy. Colon is normal. Vascular/Lymphatic: Minimal calcified plaque over the abdominal aorta which is normal in caliber. No adenopathy. Reproductive: Normal. Other: No free fluid or focal inflammatory change. Musculoskeletal: Mild degenerative change of the spine with multilevel disc disease throughout the lumbar spine. Mild degenerative change of the hips. IMPRESSION: 1. No acute findings in the abdomen/pelvis. 2. Aortic atherosclerosis. Aortic Atherosclerosis (ICD10-I70.0). Electronically Signed   By: DMarin OlpM.D.   On: 04/18/2021 16:27    Procedures Procedures    Medications Ordered in ED Medications  iohexol (OMNIPAQUE) 300 MG/ML solution 100 mL (100 mLs Intravenous Contrast Given 04/18/21 1613)    ED Course/ Medical Decision Making/ A&P Clinical Course as of 04/19/21 1015  Fri Apr 18, 2021  1702 Patient remains asymptomatic.  I reviewed his work-up with him which did not show any significant cause of his symptoms.  Is possible it is referred thoracic pain as it occurred only when he was bending over.  Okay for discharge [MT]    Clinical Course User Index [MT] Rilley Stash, MCarola Rhine MD                           Medical  Decision Making Amount and/or Complexity of Data Reviewed Labs: ordered. Radiology: ordered.  Risk Prescription drug management.   This patient presents to the ED with concern for intermittent abdominal pain.  This involves an extensive number of treatment options, and is a complaint that carries with it a high risk of complications and morbidity.  The differential diagnosis includes biliary colic versus ureteral colic versus hernia (not palpable on exam) versus partial bowel obstruction versus  I ordered and personally interpreted labs.  The pertinent results include:  minor leukocytosis, otherwise unremarkable  With his presentation of a low clinical suspicion for ACS, pulmonary embolism, aortic dissection, or mesenteric ischemia.  I doubt incarcerated hernia.  I ordered imaging studies including CT abdomen pelvis with IV contrast I independently visualized and interpreted imaging which showed no acute findings to  explain symptoms I agree with the radiologist interpretation   After the interventions noted above, I reevaluated the patient and found that they have: stayed the same.  Asymptomatic in ED.  Dispostion:  After consideration of the diagnostic results and the patients response to treatment, I feel that the patent would benefit from outpatient follow up.  Doubt AAA, appendicitis.         Final Clinical Impression(s) / ED Diagnoses Final diagnoses:  Abdominal pain, unspecified abdominal location    Rx / DC Orders ED Discharge Orders     None         Wyvonnia Dusky, MD 04/19/21 1015

## 2021-04-18 NOTE — ED Notes (Addendum)
Patient Alert and oriented to baseline. Stable and ambulatory to baseline. Patient verbalized understanding of the discharge instructions.  Patient belongings were taken by the patient.   

## 2021-05-29 DIAGNOSIS — Z0001 Encounter for general adult medical examination with abnormal findings: Secondary | ICD-10-CM | POA: Diagnosis not present

## 2021-05-29 DIAGNOSIS — E782 Mixed hyperlipidemia: Secondary | ICD-10-CM | POA: Diagnosis not present

## 2021-05-29 DIAGNOSIS — E039 Hypothyroidism, unspecified: Secondary | ICD-10-CM | POA: Diagnosis not present

## 2021-05-29 DIAGNOSIS — Z9229 Personal history of other drug therapy: Secondary | ICD-10-CM | POA: Diagnosis not present

## 2021-05-29 DIAGNOSIS — G894 Chronic pain syndrome: Secondary | ICD-10-CM | POA: Diagnosis not present

## 2021-05-29 DIAGNOSIS — M159 Polyosteoarthritis, unspecified: Secondary | ICD-10-CM | POA: Diagnosis not present

## 2021-05-29 DIAGNOSIS — J449 Chronic obstructive pulmonary disease, unspecified: Secondary | ICD-10-CM | POA: Diagnosis not present

## 2021-05-29 DIAGNOSIS — E559 Vitamin D deficiency, unspecified: Secondary | ICD-10-CM | POA: Diagnosis not present

## 2021-06-30 DIAGNOSIS — J329 Chronic sinusitis, unspecified: Secondary | ICD-10-CM | POA: Diagnosis not present

## 2021-06-30 DIAGNOSIS — J40 Bronchitis, not specified as acute or chronic: Secondary | ICD-10-CM | POA: Diagnosis not present

## 2021-06-30 DIAGNOSIS — J441 Chronic obstructive pulmonary disease with (acute) exacerbation: Secondary | ICD-10-CM | POA: Diagnosis not present

## 2021-07-31 DIAGNOSIS — J329 Chronic sinusitis, unspecified: Secondary | ICD-10-CM | POA: Diagnosis not present

## 2021-07-31 DIAGNOSIS — G894 Chronic pain syndrome: Secondary | ICD-10-CM | POA: Diagnosis not present

## 2021-07-31 DIAGNOSIS — M1991 Primary osteoarthritis, unspecified site: Secondary | ICD-10-CM | POA: Diagnosis not present

## 2021-07-31 DIAGNOSIS — D110 Benign neoplasm of parotid gland: Secondary | ICD-10-CM | POA: Diagnosis not present

## 2021-07-31 DIAGNOSIS — Z9229 Personal history of other drug therapy: Secondary | ICD-10-CM | POA: Diagnosis not present

## 2021-07-31 DIAGNOSIS — J40 Bronchitis, not specified as acute or chronic: Secondary | ICD-10-CM | POA: Diagnosis not present

## 2021-07-31 DIAGNOSIS — J441 Chronic obstructive pulmonary disease with (acute) exacerbation: Secondary | ICD-10-CM | POA: Diagnosis not present

## 2021-10-02 DIAGNOSIS — M159 Polyosteoarthritis, unspecified: Secondary | ICD-10-CM | POA: Diagnosis not present

## 2021-10-02 DIAGNOSIS — G894 Chronic pain syndrome: Secondary | ICD-10-CM | POA: Diagnosis not present

## 2021-10-02 DIAGNOSIS — J449 Chronic obstructive pulmonary disease, unspecified: Secondary | ICD-10-CM | POA: Diagnosis not present

## 2021-11-28 DIAGNOSIS — M159 Polyosteoarthritis, unspecified: Secondary | ICD-10-CM | POA: Diagnosis not present

## 2021-11-28 DIAGNOSIS — J449 Chronic obstructive pulmonary disease, unspecified: Secondary | ICD-10-CM | POA: Diagnosis not present

## 2021-11-28 DIAGNOSIS — G894 Chronic pain syndrome: Secondary | ICD-10-CM | POA: Diagnosis not present

## 2022-01-23 DIAGNOSIS — J449 Chronic obstructive pulmonary disease, unspecified: Secondary | ICD-10-CM | POA: Diagnosis not present

## 2022-01-23 DIAGNOSIS — M159 Polyosteoarthritis, unspecified: Secondary | ICD-10-CM | POA: Diagnosis not present

## 2022-01-23 DIAGNOSIS — G894 Chronic pain syndrome: Secondary | ICD-10-CM | POA: Diagnosis not present

## 2022-02-13 DIAGNOSIS — J449 Chronic obstructive pulmonary disease, unspecified: Secondary | ICD-10-CM | POA: Diagnosis not present

## 2022-04-06 DIAGNOSIS — E039 Hypothyroidism, unspecified: Secondary | ICD-10-CM | POA: Diagnosis not present

## 2022-04-06 DIAGNOSIS — J449 Chronic obstructive pulmonary disease, unspecified: Secondary | ICD-10-CM | POA: Diagnosis not present

## 2022-04-06 DIAGNOSIS — I700 Atherosclerosis of aorta: Secondary | ICD-10-CM | POA: Diagnosis not present

## 2022-04-06 DIAGNOSIS — E559 Vitamin D deficiency, unspecified: Secondary | ICD-10-CM | POA: Diagnosis not present

## 2022-04-06 DIAGNOSIS — G894 Chronic pain syndrome: Secondary | ICD-10-CM | POA: Diagnosis not present

## 2022-04-06 DIAGNOSIS — M159 Polyosteoarthritis, unspecified: Secondary | ICD-10-CM | POA: Diagnosis not present

## 2022-04-06 DIAGNOSIS — Z0001 Encounter for general adult medical examination with abnormal findings: Secondary | ICD-10-CM | POA: Diagnosis not present

## 2022-04-06 DIAGNOSIS — D518 Other vitamin B12 deficiency anemias: Secondary | ICD-10-CM | POA: Diagnosis not present

## 2022-06-04 DIAGNOSIS — M159 Polyosteoarthritis, unspecified: Secondary | ICD-10-CM | POA: Diagnosis not present

## 2022-06-04 DIAGNOSIS — J449 Chronic obstructive pulmonary disease, unspecified: Secondary | ICD-10-CM | POA: Diagnosis not present

## 2022-06-04 DIAGNOSIS — G894 Chronic pain syndrome: Secondary | ICD-10-CM | POA: Diagnosis not present

## 2022-06-04 DIAGNOSIS — I700 Atherosclerosis of aorta: Secondary | ICD-10-CM | POA: Diagnosis not present

## 2022-07-31 DIAGNOSIS — M159 Polyosteoarthritis, unspecified: Secondary | ICD-10-CM | POA: Diagnosis not present

## 2022-07-31 DIAGNOSIS — J449 Chronic obstructive pulmonary disease, unspecified: Secondary | ICD-10-CM | POA: Diagnosis not present

## 2022-07-31 DIAGNOSIS — G894 Chronic pain syndrome: Secondary | ICD-10-CM | POA: Diagnosis not present

## 2022-10-01 DIAGNOSIS — M159 Polyosteoarthritis, unspecified: Secondary | ICD-10-CM | POA: Diagnosis not present

## 2022-10-01 DIAGNOSIS — G894 Chronic pain syndrome: Secondary | ICD-10-CM | POA: Diagnosis not present

## 2022-10-01 DIAGNOSIS — J449 Chronic obstructive pulmonary disease, unspecified: Secondary | ICD-10-CM | POA: Diagnosis not present

## 2022-11-27 DIAGNOSIS — G894 Chronic pain syndrome: Secondary | ICD-10-CM | POA: Diagnosis not present

## 2022-11-27 DIAGNOSIS — J449 Chronic obstructive pulmonary disease, unspecified: Secondary | ICD-10-CM | POA: Diagnosis not present

## 2022-11-27 DIAGNOSIS — M159 Polyosteoarthritis, unspecified: Secondary | ICD-10-CM | POA: Diagnosis not present

## 2023-02-02 DIAGNOSIS — J449 Chronic obstructive pulmonary disease, unspecified: Secondary | ICD-10-CM | POA: Diagnosis not present

## 2023-02-02 DIAGNOSIS — M159 Polyosteoarthritis, unspecified: Secondary | ICD-10-CM | POA: Diagnosis not present

## 2023-02-02 DIAGNOSIS — G894 Chronic pain syndrome: Secondary | ICD-10-CM | POA: Diagnosis not present

## 2023-04-05 DIAGNOSIS — I700 Atherosclerosis of aorta: Secondary | ICD-10-CM | POA: Diagnosis not present

## 2023-04-05 DIAGNOSIS — M159 Polyosteoarthritis, unspecified: Secondary | ICD-10-CM | POA: Diagnosis not present

## 2023-04-05 DIAGNOSIS — G894 Chronic pain syndrome: Secondary | ICD-10-CM | POA: Diagnosis not present

## 2023-04-05 DIAGNOSIS — J449 Chronic obstructive pulmonary disease, unspecified: Secondary | ICD-10-CM | POA: Diagnosis not present

## 2023-04-07 IMAGING — CT CT ABD-PELV W/ CM
2 of 5 series · 17 of 46 positions shown, 19 images · IV contrast (Omnipaque or Isovue)
Comparison: 09/30/2012

CLINICAL DATA: Acute intermittent abdominal pain left lower
quadrant and left upper quadrant.

EXAM:
CT ABDOMEN AND PELVIS WITH CONTRAST
TECHNIQUE: Multidetector CT imaging of the abdomen and pelvis was performed
using the standard protocol following bolus administration of
intravenous contrast.

[Series 2: axial st · axial · 0.97mm/px · z∈[+682,+1087]mm · 14 of 93 slices shown, 16 images]
[im 6/93  soft-tissue]
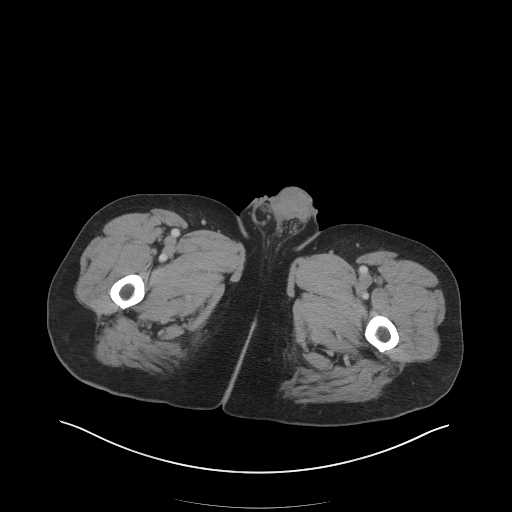
[im 6/93  bone]
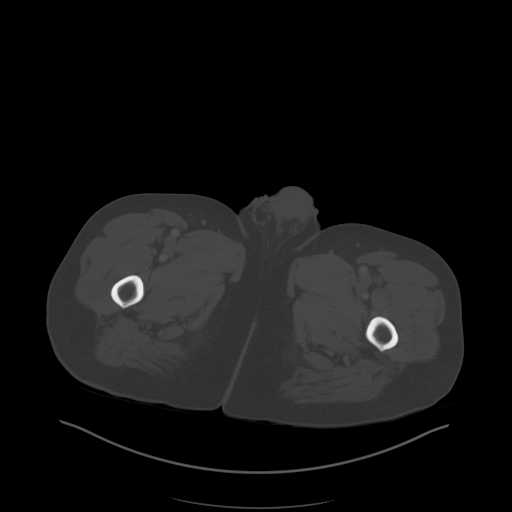
[im 11/93  soft-tissue]
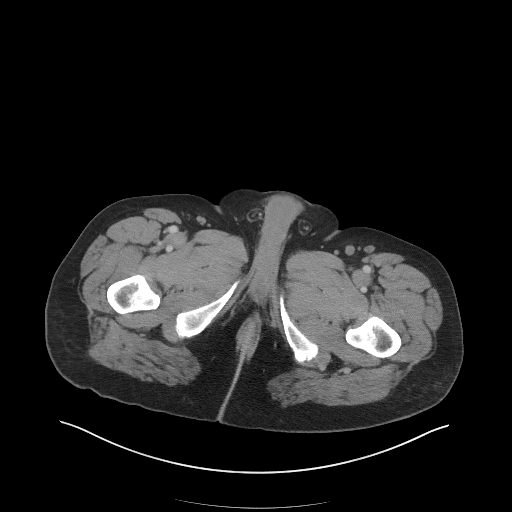
[im 21/93  soft-tissue]
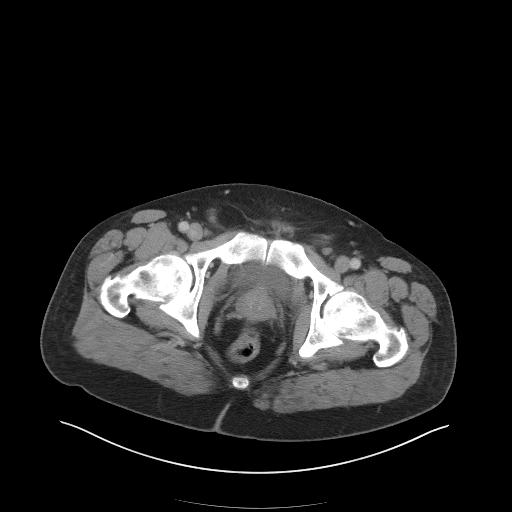
[im 26/93  soft-tissue]
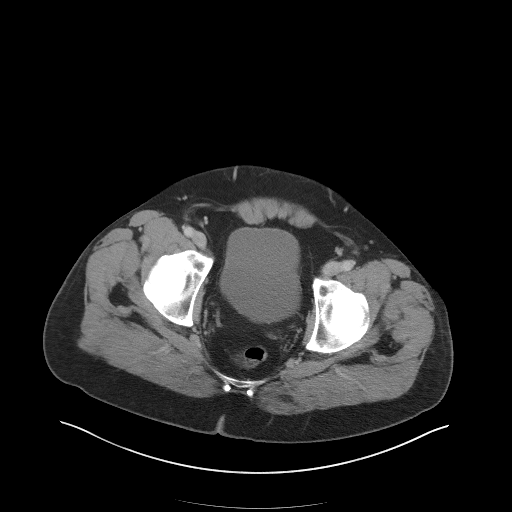
[im 31/93  soft-tissue]
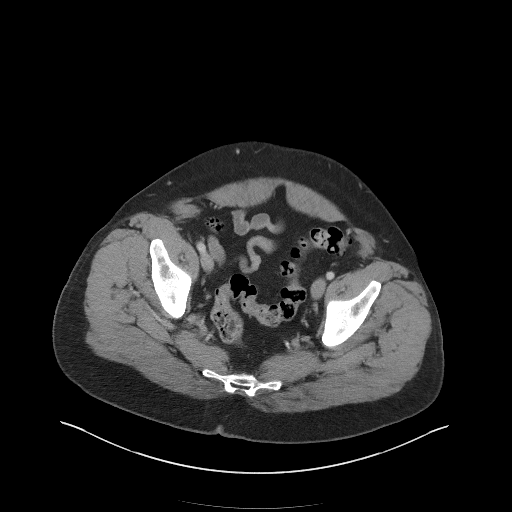
[im 36/93  soft-tissue]
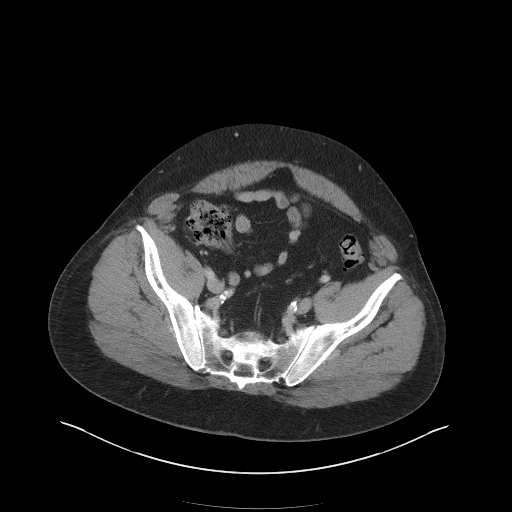
[im 41/93  soft-tissue]
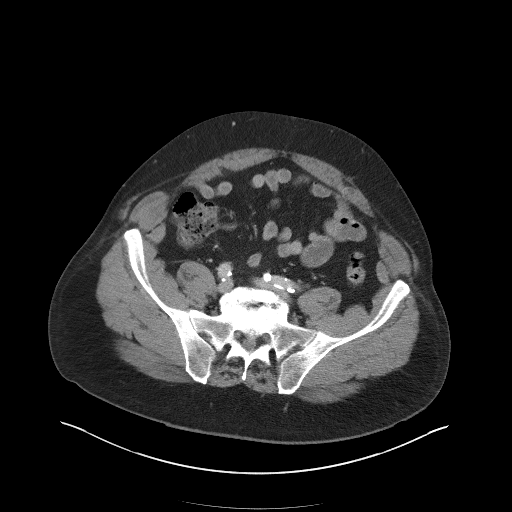
[im 52/93  soft-tissue]
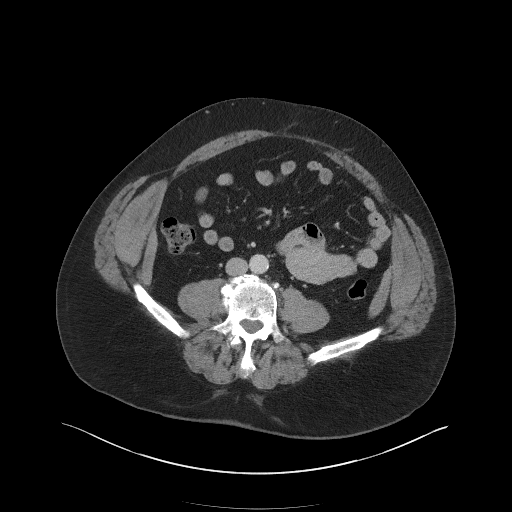
[im 57/93  soft-tissue]
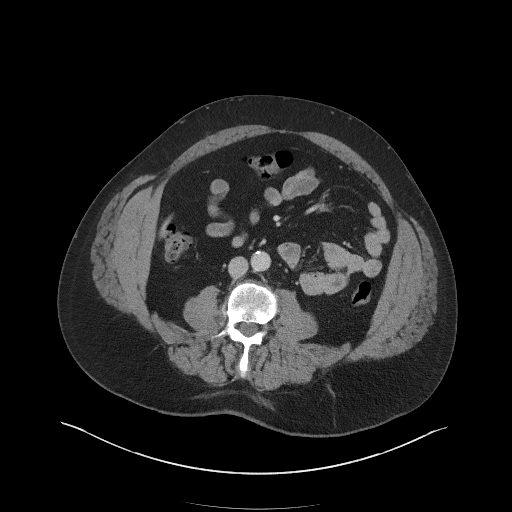
[im 57/93  bone]
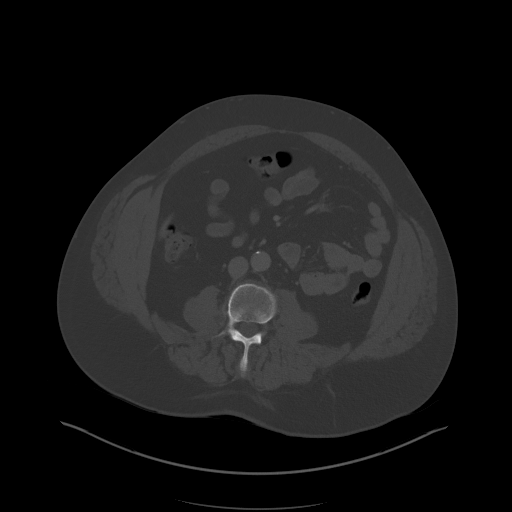
[im 62/93  soft-tissue]
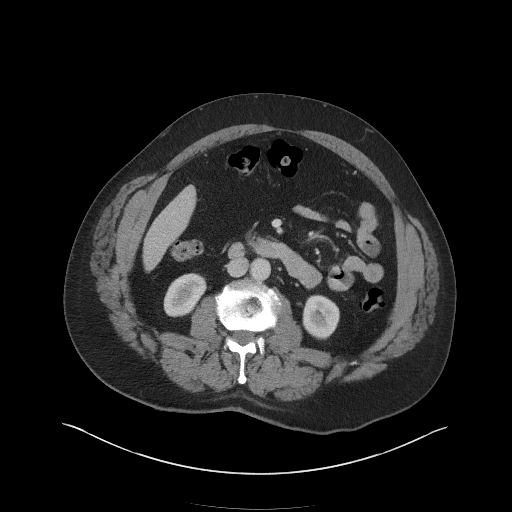
[im 67/93  soft-tissue]
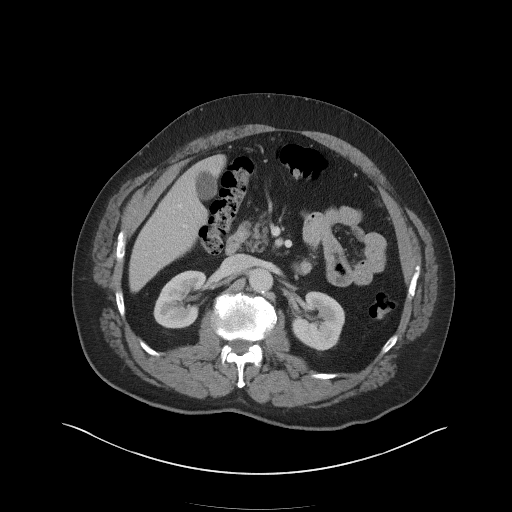
[im 72/93  soft-tissue]
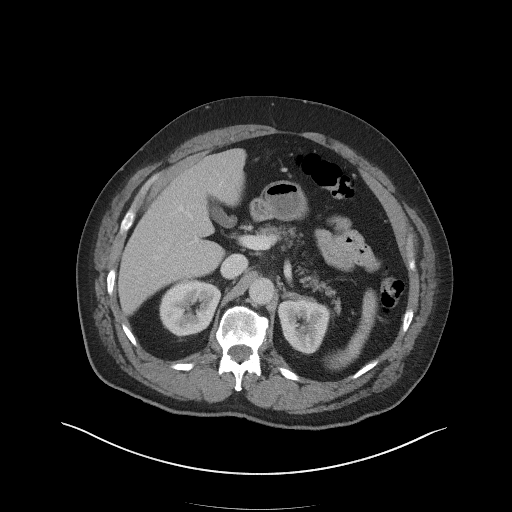
[im 82/93  soft-tissue]
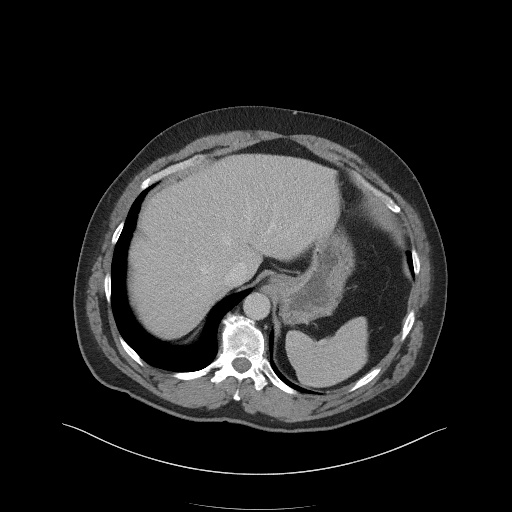
[im 87/93  soft-tissue]
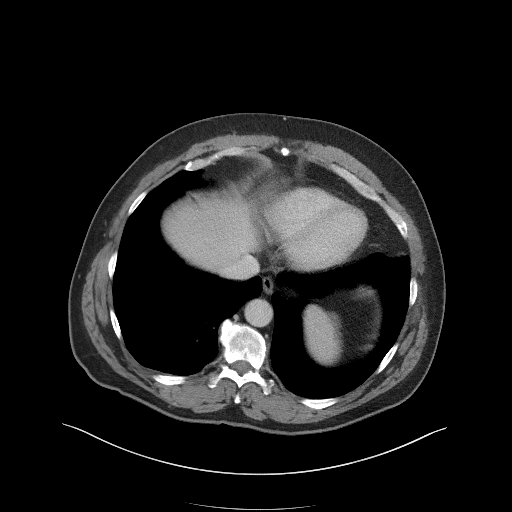

[Series 5: coronal st · coronal · 0.83mm/px · 3 of 117 slices shown]
[im 39/117  soft-tissue]
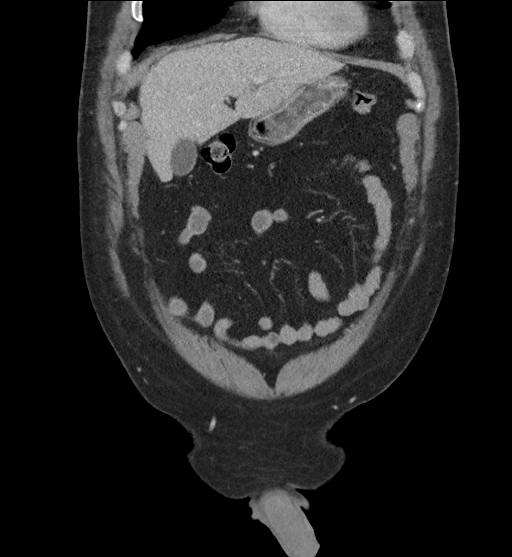
[im 52/117  soft-tissue]
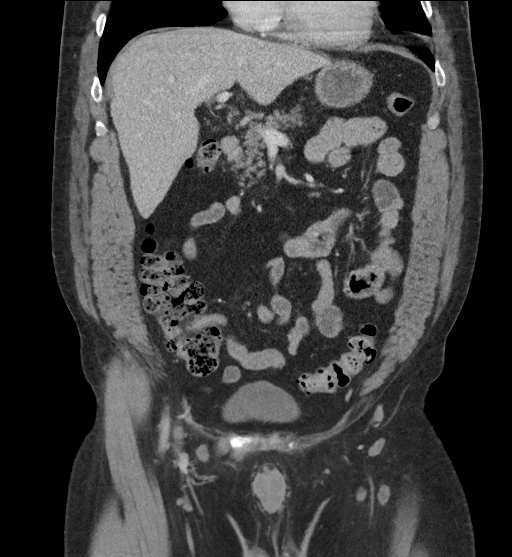
[im 65/117  soft-tissue]
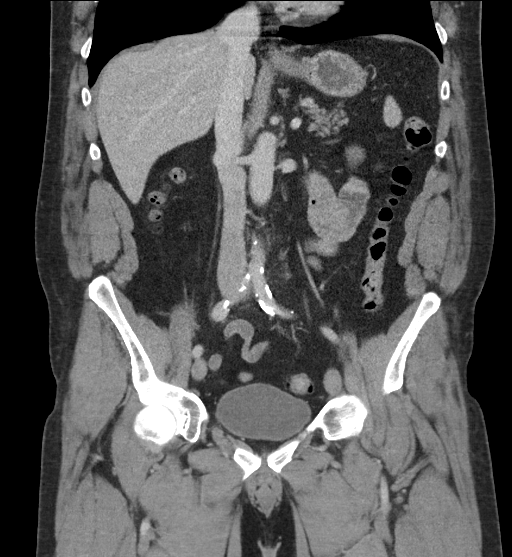

[17 of 46 positions shown; findings below may reference images not displayed]

RADIATION DOSE REDUCTION: This exam was performed according to the
departmental dose-optimization program which includes automated
exposure control, adjustment of the mA and/or kV according to
patient size and/or use of iterative reconstruction technique.

CONTRAST:  100mL OMNIPAQUE IOHEXOL 300 MG/ML  SOLN
FINDINGS: Lower chest: Lung bases are clear.

Hepatobiliary: Liver, gallbladder and biliary tree are normal.

Pancreas: Normal.

Spleen: Normal.

Adrenals/Urinary Tract: Adrenal glands are normal. Kidneys are
normal in size without hydronephrosis or nephrolithiasis. Ureters
and bladder are normal.

Stomach/Bowel: Stomach and small bowel are normal. Previous
appendectomy. Colon is normal.

Vascular/Lymphatic: Minimal calcified plaque over the abdominal
aorta which is normal in caliber. No adenopathy.

Reproductive: Normal.

Other: No free fluid or focal inflammatory change.

Musculoskeletal: Mild degenerative change of the spine with
multilevel disc disease throughout the lumbar spine. Mild
degenerative change of the hips.
IMPRESSION: 1. No acute findings in the abdomen/pelvis.
2. Aortic atherosclerosis.

Aortic Atherosclerosis ([id number]).

## 2023-06-01 DIAGNOSIS — G894 Chronic pain syndrome: Secondary | ICD-10-CM | POA: Diagnosis not present

## 2023-06-01 DIAGNOSIS — J449 Chronic obstructive pulmonary disease, unspecified: Secondary | ICD-10-CM | POA: Diagnosis not present

## 2023-06-01 DIAGNOSIS — M159 Polyosteoarthritis, unspecified: Secondary | ICD-10-CM | POA: Diagnosis not present

## 2023-06-25 DIAGNOSIS — G894 Chronic pain syndrome: Secondary | ICD-10-CM | POA: Diagnosis not present

## 2023-06-25 DIAGNOSIS — M159 Polyosteoarthritis, unspecified: Secondary | ICD-10-CM | POA: Diagnosis not present

## 2023-06-25 DIAGNOSIS — J449 Chronic obstructive pulmonary disease, unspecified: Secondary | ICD-10-CM | POA: Diagnosis not present

## 2023-07-16 DIAGNOSIS — R6889 Other general symptoms and signs: Secondary | ICD-10-CM | POA: Diagnosis not present

## 2023-07-16 DIAGNOSIS — J40 Bronchitis, not specified as acute or chronic: Secondary | ICD-10-CM | POA: Diagnosis not present

## 2023-08-30 DIAGNOSIS — G894 Chronic pain syndrome: Secondary | ICD-10-CM | POA: Diagnosis not present

## 2023-08-30 DIAGNOSIS — J441 Chronic obstructive pulmonary disease with (acute) exacerbation: Secondary | ICD-10-CM | POA: Diagnosis not present

## 2023-08-30 DIAGNOSIS — M159 Polyosteoarthritis, unspecified: Secondary | ICD-10-CM | POA: Diagnosis not present

## 2023-09-09 DIAGNOSIS — L235 Allergic contact dermatitis due to other chemical products: Secondary | ICD-10-CM | POA: Diagnosis not present

## 2023-09-27 DIAGNOSIS — J449 Chronic obstructive pulmonary disease, unspecified: Secondary | ICD-10-CM | POA: Diagnosis not present

## 2023-09-27 DIAGNOSIS — G894 Chronic pain syndrome: Secondary | ICD-10-CM | POA: Diagnosis not present

## 2023-09-27 DIAGNOSIS — M159 Polyosteoarthritis, unspecified: Secondary | ICD-10-CM | POA: Diagnosis not present

## 2023-10-29 DIAGNOSIS — J449 Chronic obstructive pulmonary disease, unspecified: Secondary | ICD-10-CM | POA: Diagnosis not present

## 2023-10-29 DIAGNOSIS — G894 Chronic pain syndrome: Secondary | ICD-10-CM | POA: Diagnosis not present

## 2023-10-29 DIAGNOSIS — M159 Polyosteoarthritis, unspecified: Secondary | ICD-10-CM | POA: Diagnosis not present
